# Patient Record
Sex: Male | Born: 1960 | Race: White | Hispanic: No | Marital: Single | Smoking: Current every day smoker
Health system: Southern US, Academic
[De-identification: ages and names within clinical notes are randomized; demographics above are authoritative.]

## PROBLEM LIST (undated history)

## (undated) DIAGNOSIS — F32A Depression, unspecified: Secondary | ICD-10-CM

## (undated) DIAGNOSIS — R21 Rash and other nonspecific skin eruption: Secondary | ICD-10-CM

## (undated) DIAGNOSIS — F329 Major depressive disorder, single episode, unspecified: Secondary | ICD-10-CM

## (undated) DIAGNOSIS — M5020 Other cervical disc displacement, unspecified cervical region: Secondary | ICD-10-CM

## (undated) DIAGNOSIS — B20 Human immunodeficiency virus [HIV] disease: Secondary | ICD-10-CM

## (undated) DIAGNOSIS — F419 Anxiety disorder, unspecified: Secondary | ICD-10-CM

## (undated) DIAGNOSIS — Z9109 Other allergy status, other than to drugs and biological substances: Secondary | ICD-10-CM

## (undated) DIAGNOSIS — L0591 Pilonidal cyst without abscess: Secondary | ICD-10-CM

## (undated) DIAGNOSIS — J45909 Unspecified asthma, uncomplicated: Secondary | ICD-10-CM

## (undated) DIAGNOSIS — F17200 Nicotine dependence, unspecified, uncomplicated: Secondary | ICD-10-CM

## (undated) DIAGNOSIS — Z21 Asymptomatic human immunodeficiency virus [HIV] infection status: Secondary | ICD-10-CM

## (undated) HISTORY — PX: CYST REMOVAL TRUNK: SHX6283

## (undated) HISTORY — DX: Anxiety disorder, unspecified: F41.9

## (undated) HISTORY — PX: HX CYST INCISION AND DRAINAGE: SHX14

## (undated) HISTORY — DX: Other cervical disc displacement, unspecified cervical region: M50.20

## (undated) HISTORY — DX: Other allergy status, other than to drugs and biological substances: Z91.09

## (undated) HISTORY — DX: Depression, unspecified: F32.A

## (undated) HISTORY — DX: Pilonidal cyst without abscess: L05.91

## (undated) HISTORY — DX: Rash and other nonspecific skin eruption: R21

## (undated) HISTORY — DX: Unspecified asthma, uncomplicated: J45.909

## (undated) HISTORY — DX: Human immunodeficiency virus (HIV) disease (CMS HCC): B20

---
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
## 1995-11-06 ENCOUNTER — Observation Stay (HOSPITAL_COMMUNITY): Payer: Self-pay | Admitting: Family Medicine

## 1997-08-13 ENCOUNTER — Ambulatory Visit (INDEPENDENT_AMBULATORY_CARE_PROVIDER_SITE_OTHER): Payer: Self-pay

## 1997-08-17 ENCOUNTER — Ambulatory Visit (INDEPENDENT_AMBULATORY_CARE_PROVIDER_SITE_OTHER): Payer: Self-pay | Admitting: Infectious Disease

## 1997-09-03 ENCOUNTER — Ambulatory Visit (INDEPENDENT_AMBULATORY_CARE_PROVIDER_SITE_OTHER): Payer: Self-pay | Admitting: Infectious Disease

## 1997-09-08 ENCOUNTER — Ambulatory Visit (INDEPENDENT_AMBULATORY_CARE_PROVIDER_SITE_OTHER): Payer: Self-pay

## 1997-09-17 ENCOUNTER — Ambulatory Visit (INDEPENDENT_AMBULATORY_CARE_PROVIDER_SITE_OTHER): Payer: Self-pay | Admitting: Infectious Disease

## 1997-10-15 ENCOUNTER — Ambulatory Visit (INDEPENDENT_AMBULATORY_CARE_PROVIDER_SITE_OTHER): Payer: Self-pay | Admitting: Infectious Disease

## 1997-11-12 ENCOUNTER — Ambulatory Visit (INDEPENDENT_AMBULATORY_CARE_PROVIDER_SITE_OTHER): Payer: Self-pay

## 1997-11-24 ENCOUNTER — Ambulatory Visit (INDEPENDENT_AMBULATORY_CARE_PROVIDER_SITE_OTHER): Payer: Self-pay | Admitting: Infectious Disease

## 1997-12-07 ENCOUNTER — Emergency Department (HOSPITAL_COMMUNITY): Payer: Self-pay

## 1997-12-08 ENCOUNTER — Emergency Department (HOSPITAL_COMMUNITY): Payer: Self-pay

## 1997-12-09 ENCOUNTER — Ambulatory Visit (INDEPENDENT_AMBULATORY_CARE_PROVIDER_SITE_OTHER): Payer: Self-pay

## 1997-12-10 ENCOUNTER — Inpatient Hospital Stay (HOSPITAL_COMMUNITY): Payer: Self-pay | Admitting: Internal Medicine

## 1997-12-23 ENCOUNTER — Ambulatory Visit (INDEPENDENT_AMBULATORY_CARE_PROVIDER_SITE_OTHER): Payer: Self-pay | Admitting: Ophthalmology

## 1997-12-24 ENCOUNTER — Ambulatory Visit (INDEPENDENT_AMBULATORY_CARE_PROVIDER_SITE_OTHER): Payer: Self-pay | Admitting: Infectious Disease

## 1997-12-29 ENCOUNTER — Ambulatory Visit (INDEPENDENT_AMBULATORY_CARE_PROVIDER_SITE_OTHER): Payer: Self-pay

## 1998-01-07 ENCOUNTER — Ambulatory Visit (INDEPENDENT_AMBULATORY_CARE_PROVIDER_SITE_OTHER): Payer: Self-pay | Admitting: Ophthalmology

## 1998-02-01 ENCOUNTER — Emergency Department (HOSPITAL_COMMUNITY): Payer: Self-pay

## 1998-02-02 ENCOUNTER — Ambulatory Visit (INDEPENDENT_AMBULATORY_CARE_PROVIDER_SITE_OTHER): Payer: Self-pay | Admitting: Infectious Disease

## 1998-02-18 ENCOUNTER — Ambulatory Visit (INDEPENDENT_AMBULATORY_CARE_PROVIDER_SITE_OTHER): Payer: Self-pay | Admitting: Infectious Disease

## 1998-03-02 ENCOUNTER — Ambulatory Visit (INDEPENDENT_AMBULATORY_CARE_PROVIDER_SITE_OTHER): Payer: Self-pay | Admitting: Ophthalmology

## 1998-03-23 ENCOUNTER — Ambulatory Visit (INDEPENDENT_AMBULATORY_CARE_PROVIDER_SITE_OTHER): Payer: Self-pay | Admitting: Ophthalmology

## 1998-05-17 ENCOUNTER — Ambulatory Visit (INDEPENDENT_AMBULATORY_CARE_PROVIDER_SITE_OTHER): Payer: Self-pay | Admitting: Infectious Disease

## 1998-07-30 ENCOUNTER — Ambulatory Visit (HOSPITAL_COMMUNITY): Payer: Self-pay

## 1998-08-02 ENCOUNTER — Emergency Department (HOSPITAL_COMMUNITY): Payer: Self-pay | Admitting: Emergency Medicine-WVUH

## 1998-08-05 ENCOUNTER — Ambulatory Visit (INDEPENDENT_AMBULATORY_CARE_PROVIDER_SITE_OTHER): Payer: Self-pay | Admitting: Infectious Disease

## 1998-08-27 ENCOUNTER — Ambulatory Visit (INDEPENDENT_AMBULATORY_CARE_PROVIDER_SITE_OTHER): Payer: Self-pay | Admitting: Infectious Disease

## 1998-10-20 ENCOUNTER — Ambulatory Visit (INDEPENDENT_AMBULATORY_CARE_PROVIDER_SITE_OTHER): Payer: Self-pay

## 1998-11-04 ENCOUNTER — Ambulatory Visit (INDEPENDENT_AMBULATORY_CARE_PROVIDER_SITE_OTHER): Payer: Self-pay | Admitting: Infectious Disease

## 1999-01-06 ENCOUNTER — Ambulatory Visit (INDEPENDENT_AMBULATORY_CARE_PROVIDER_SITE_OTHER): Payer: Self-pay | Admitting: Infectious Disease

## 1999-03-09 ENCOUNTER — Ambulatory Visit (INDEPENDENT_AMBULATORY_CARE_PROVIDER_SITE_OTHER): Payer: Self-pay

## 1999-03-24 ENCOUNTER — Ambulatory Visit (INDEPENDENT_AMBULATORY_CARE_PROVIDER_SITE_OTHER): Payer: Self-pay | Admitting: Infectious Disease

## 1999-05-19 ENCOUNTER — Ambulatory Visit (INDEPENDENT_AMBULATORY_CARE_PROVIDER_SITE_OTHER): Payer: Self-pay | Admitting: Infectious Disease

## 1999-09-15 ENCOUNTER — Ambulatory Visit (INDEPENDENT_AMBULATORY_CARE_PROVIDER_SITE_OTHER): Payer: Self-pay | Admitting: Infectious Disease

## 1999-11-14 ENCOUNTER — Ambulatory Visit (INDEPENDENT_AMBULATORY_CARE_PROVIDER_SITE_OTHER): Payer: Self-pay | Admitting: Infectious Disease

## 1999-12-29 ENCOUNTER — Ambulatory Visit (INDEPENDENT_AMBULATORY_CARE_PROVIDER_SITE_OTHER): Payer: Self-pay

## 2000-01-12 ENCOUNTER — Ambulatory Visit (INDEPENDENT_AMBULATORY_CARE_PROVIDER_SITE_OTHER): Payer: Self-pay | Admitting: Infectious Disease

## 2000-04-26 ENCOUNTER — Ambulatory Visit (INDEPENDENT_AMBULATORY_CARE_PROVIDER_SITE_OTHER): Payer: Self-pay | Admitting: Infectious Disease

## 2000-06-04 ENCOUNTER — Ambulatory Visit (INDEPENDENT_AMBULATORY_CARE_PROVIDER_SITE_OTHER): Payer: Self-pay

## 2000-07-05 ENCOUNTER — Ambulatory Visit (INDEPENDENT_AMBULATORY_CARE_PROVIDER_SITE_OTHER): Payer: Self-pay | Admitting: Infectious Disease

## 2000-08-14 ENCOUNTER — Ambulatory Visit (INDEPENDENT_AMBULATORY_CARE_PROVIDER_SITE_OTHER): Payer: Self-pay

## 2000-09-06 ENCOUNTER — Ambulatory Visit (INDEPENDENT_AMBULATORY_CARE_PROVIDER_SITE_OTHER): Payer: Self-pay | Admitting: Infectious Disease

## 2000-12-06 ENCOUNTER — Ambulatory Visit (INDEPENDENT_AMBULATORY_CARE_PROVIDER_SITE_OTHER): Payer: Self-pay | Admitting: Infectious Disease

## 2001-03-07 ENCOUNTER — Ambulatory Visit (INDEPENDENT_AMBULATORY_CARE_PROVIDER_SITE_OTHER): Payer: Self-pay | Admitting: Infectious Disease

## 2001-04-12 ENCOUNTER — Ambulatory Visit (INDEPENDENT_AMBULATORY_CARE_PROVIDER_SITE_OTHER): Payer: Self-pay

## 2001-05-20 ENCOUNTER — Ambulatory Visit (INDEPENDENT_AMBULATORY_CARE_PROVIDER_SITE_OTHER): Payer: Self-pay

## 2001-05-23 ENCOUNTER — Ambulatory Visit (INDEPENDENT_AMBULATORY_CARE_PROVIDER_SITE_OTHER): Payer: Self-pay

## 2001-06-05 ENCOUNTER — Ambulatory Visit (INDEPENDENT_AMBULATORY_CARE_PROVIDER_SITE_OTHER): Payer: Self-pay

## 2001-06-27 ENCOUNTER — Ambulatory Visit (INDEPENDENT_AMBULATORY_CARE_PROVIDER_SITE_OTHER): Payer: Self-pay | Admitting: Infectious Disease

## 2001-09-26 ENCOUNTER — Ambulatory Visit (INDEPENDENT_AMBULATORY_CARE_PROVIDER_SITE_OTHER): Payer: Self-pay | Admitting: Infectious Disease

## 2001-11-28 ENCOUNTER — Ambulatory Visit (INDEPENDENT_AMBULATORY_CARE_PROVIDER_SITE_OTHER): Payer: Self-pay | Admitting: Infectious Disease

## 2002-04-17 ENCOUNTER — Ambulatory Visit (INDEPENDENT_AMBULATORY_CARE_PROVIDER_SITE_OTHER): Payer: Self-pay | Admitting: Infectious Disease

## 2002-07-25 ENCOUNTER — Ambulatory Visit (INDEPENDENT_AMBULATORY_CARE_PROVIDER_SITE_OTHER): Payer: Self-pay | Admitting: Infectious Disease

## 2002-11-05 ENCOUNTER — Ambulatory Visit (INDEPENDENT_AMBULATORY_CARE_PROVIDER_SITE_OTHER): Payer: Self-pay | Admitting: Infectious Disease

## 2003-02-25 ENCOUNTER — Ambulatory Visit (INDEPENDENT_AMBULATORY_CARE_PROVIDER_SITE_OTHER): Payer: Self-pay | Admitting: Infectious Disease

## 2003-03-18 ENCOUNTER — Ambulatory Visit (INDEPENDENT_AMBULATORY_CARE_PROVIDER_SITE_OTHER): Payer: Self-pay | Admitting: Infectious Disease

## 2003-05-27 ENCOUNTER — Ambulatory Visit (INDEPENDENT_AMBULATORY_CARE_PROVIDER_SITE_OTHER): Payer: Self-pay

## 2003-06-09 ENCOUNTER — Ambulatory Visit (INDEPENDENT_AMBULATORY_CARE_PROVIDER_SITE_OTHER): Payer: Self-pay | Admitting: Infectious Disease

## 2003-06-17 ENCOUNTER — Ambulatory Visit (INDEPENDENT_AMBULATORY_CARE_PROVIDER_SITE_OTHER): Payer: Self-pay | Admitting: Infectious Disease

## 2003-06-29 ENCOUNTER — Ambulatory Visit (INDEPENDENT_AMBULATORY_CARE_PROVIDER_SITE_OTHER): Payer: Self-pay

## 2003-07-28 ENCOUNTER — Ambulatory Visit (INDEPENDENT_AMBULATORY_CARE_PROVIDER_SITE_OTHER): Payer: Self-pay

## 2003-09-02 ENCOUNTER — Ambulatory Visit (INDEPENDENT_AMBULATORY_CARE_PROVIDER_SITE_OTHER): Payer: Self-pay

## 2003-09-29 ENCOUNTER — Ambulatory Visit (INDEPENDENT_AMBULATORY_CARE_PROVIDER_SITE_OTHER): Payer: Self-pay | Admitting: Infectious Disease

## 2003-12-01 ENCOUNTER — Ambulatory Visit (INDEPENDENT_AMBULATORY_CARE_PROVIDER_SITE_OTHER): Payer: Self-pay | Admitting: Infectious Disease

## 2003-12-02 ENCOUNTER — Ambulatory Visit (INDEPENDENT_AMBULATORY_CARE_PROVIDER_SITE_OTHER): Payer: Self-pay | Admitting: Infectious Disease

## 2004-02-03 ENCOUNTER — Ambulatory Visit (INDEPENDENT_AMBULATORY_CARE_PROVIDER_SITE_OTHER): Payer: Self-pay

## 2004-02-09 ENCOUNTER — Ambulatory Visit (INDEPENDENT_AMBULATORY_CARE_PROVIDER_SITE_OTHER): Payer: Self-pay

## 2004-03-01 ENCOUNTER — Ambulatory Visit (INDEPENDENT_AMBULATORY_CARE_PROVIDER_SITE_OTHER): Payer: Self-pay | Admitting: Infectious Disease

## 2004-03-30 ENCOUNTER — Ambulatory Visit (INDEPENDENT_AMBULATORY_CARE_PROVIDER_SITE_OTHER): Payer: Self-pay | Admitting: Infectious Disease

## 2004-06-08 ENCOUNTER — Ambulatory Visit (INDEPENDENT_AMBULATORY_CARE_PROVIDER_SITE_OTHER): Payer: Self-pay | Admitting: Infectious Disease

## 2004-06-22 ENCOUNTER — Ambulatory Visit (INDEPENDENT_AMBULATORY_CARE_PROVIDER_SITE_OTHER): Payer: Self-pay | Admitting: Infectious Disease

## 2004-06-27 ENCOUNTER — Ambulatory Visit (INDEPENDENT_AMBULATORY_CARE_PROVIDER_SITE_OTHER): Payer: Self-pay

## 2004-07-05 ENCOUNTER — Ambulatory Visit (INDEPENDENT_AMBULATORY_CARE_PROVIDER_SITE_OTHER): Payer: Self-pay

## 2005-07-12 ENCOUNTER — Ambulatory Visit (INDEPENDENT_AMBULATORY_CARE_PROVIDER_SITE_OTHER): Payer: Self-pay | Admitting: Infectious Disease

## 2005-09-26 ENCOUNTER — Ambulatory Visit (HOSPITAL_COMMUNITY): Payer: Self-pay

## 2005-12-06 ENCOUNTER — Ambulatory Visit (INDEPENDENT_AMBULATORY_CARE_PROVIDER_SITE_OTHER): Payer: Self-pay | Admitting: Infectious Disease

## 2006-01-16 ENCOUNTER — Ambulatory Visit (HOSPITAL_COMMUNITY): Payer: Self-pay

## 2006-02-21 ENCOUNTER — Ambulatory Visit (INDEPENDENT_AMBULATORY_CARE_PROVIDER_SITE_OTHER): Payer: Self-pay | Admitting: Infectious Disease

## 2006-05-10 ENCOUNTER — Ambulatory Visit (HOSPITAL_COMMUNITY): Payer: Self-pay

## 2006-07-25 ENCOUNTER — Ambulatory Visit (INDEPENDENT_AMBULATORY_CARE_PROVIDER_SITE_OTHER): Payer: Self-pay | Admitting: Infectious Disease

## 2006-08-07 ENCOUNTER — Ambulatory Visit (INDEPENDENT_AMBULATORY_CARE_PROVIDER_SITE_OTHER): Payer: Self-pay

## 2006-08-16 ENCOUNTER — Ambulatory Visit (HOSPITAL_COMMUNITY): Payer: Self-pay

## 2006-09-14 ENCOUNTER — Ambulatory Visit (HOSPITAL_COMMUNITY): Payer: Self-pay

## 2006-10-12 ENCOUNTER — Encounter (HOSPITAL_COMMUNITY): Payer: Self-pay | Admitting: Clinical

## 2006-10-25 ENCOUNTER — Encounter (INDEPENDENT_AMBULATORY_CARE_PROVIDER_SITE_OTHER): Payer: Medicare Other

## 2006-10-26 ENCOUNTER — Encounter (HOSPITAL_COMMUNITY): Payer: Medicare Other | Admitting: Clinical

## 2006-10-31 ENCOUNTER — Ambulatory Visit (INDEPENDENT_AMBULATORY_CARE_PROVIDER_SITE_OTHER): Payer: Medicare Other

## 2006-11-07 ENCOUNTER — Encounter (INDEPENDENT_AMBULATORY_CARE_PROVIDER_SITE_OTHER): Payer: Medicare Other | Admitting: Infectious Disease

## 2006-11-07 ENCOUNTER — Ambulatory Visit
Admission: RE | Admit: 2006-11-07 | Discharge: 2006-11-07 | Disposition: A | Discharge status: Home / Self Care | Payer: Medicare Other | Attending: Infectious Disease | Admitting: Infectious Disease

## 2006-11-07 DIAGNOSIS — F17200 Nicotine dependence, unspecified, uncomplicated: Secondary | ICD-10-CM | POA: Insufficient documentation

## 2006-11-07 DIAGNOSIS — M542 Cervicalgia: Secondary | ICD-10-CM | POA: Insufficient documentation

## 2006-11-07 DIAGNOSIS — F3289 Other specified depressive episodes: Secondary | ICD-10-CM | POA: Insufficient documentation

## 2006-11-07 DIAGNOSIS — R634 Abnormal weight loss: Secondary | ICD-10-CM | POA: Insufficient documentation

## 2006-11-07 DIAGNOSIS — J45909 Unspecified asthma, uncomplicated: Secondary | ICD-10-CM | POA: Insufficient documentation

## 2006-11-07 DIAGNOSIS — G609 Hereditary and idiopathic neuropathy, unspecified: Secondary | ICD-10-CM | POA: Insufficient documentation

## 2006-11-07 DIAGNOSIS — B20 Human immunodeficiency virus [HIV] disease: Secondary | ICD-10-CM | POA: Insufficient documentation

## 2007-01-16 ENCOUNTER — Encounter (INDEPENDENT_AMBULATORY_CARE_PROVIDER_SITE_OTHER): Payer: Medicare Other | Admitting: Infectious Disease

## 2007-02-18 ENCOUNTER — Encounter (INDEPENDENT_AMBULATORY_CARE_PROVIDER_SITE_OTHER): Payer: Medicare Other | Admitting: Ophthalmology

## 2007-03-12 ENCOUNTER — Encounter (INDEPENDENT_AMBULATORY_CARE_PROVIDER_SITE_OTHER): Payer: Medicare Other

## 2007-04-17 ENCOUNTER — Encounter (INDEPENDENT_AMBULATORY_CARE_PROVIDER_SITE_OTHER): Payer: Medicare Other | Admitting: Infectious Disease

## 2007-04-17 ENCOUNTER — Ambulatory Visit
Admission: RE | Admit: 2007-04-17 | Discharge: 2007-04-17 | Disposition: A | Discharge status: Home / Self Care | Payer: Medicare Other | Attending: Infectious Disease | Admitting: Infectious Disease

## 2007-04-17 DIAGNOSIS — B20 Human immunodeficiency virus [HIV] disease: Secondary | ICD-10-CM | POA: Insufficient documentation

## 2007-04-24 ENCOUNTER — Encounter (INDEPENDENT_AMBULATORY_CARE_PROVIDER_SITE_OTHER): Payer: Medicare Other | Admitting: Infectious Disease

## 2007-05-29 ENCOUNTER — Encounter (INDEPENDENT_AMBULATORY_CARE_PROVIDER_SITE_OTHER): Payer: Medicare Other | Admitting: Infectious Disease

## 2007-05-29 ENCOUNTER — Encounter (INDEPENDENT_AMBULATORY_CARE_PROVIDER_SITE_OTHER): Payer: Medicare Other

## 2007-07-02 ENCOUNTER — Other Ambulatory Visit: Payer: Self-pay

## 2007-07-02 ENCOUNTER — Encounter (INDEPENDENT_AMBULATORY_CARE_PROVIDER_SITE_OTHER): Payer: Medicare Other

## 2007-08-21 ENCOUNTER — Encounter (INDEPENDENT_AMBULATORY_CARE_PROVIDER_SITE_OTHER): Payer: Medicare Other | Admitting: Infectious Disease

## 2007-08-21 ENCOUNTER — Ambulatory Visit
Admission: RE | Admit: 2007-08-21 | Discharge: 2007-08-21 | Disposition: A | Discharge status: Home / Self Care | Payer: Medicare Other | Attending: Infectious Disease | Admitting: Infectious Disease

## 2007-08-21 DIAGNOSIS — Z5181 Encounter for therapeutic drug level monitoring: Secondary | ICD-10-CM | POA: Insufficient documentation

## 2007-08-21 DIAGNOSIS — B20 Human immunodeficiency virus [HIV] disease: Secondary | ICD-10-CM | POA: Insufficient documentation

## 2007-08-21 LAB — CBC/DIFF
BASOPHILS: 0 % (ref 0–1)
BASOS ABS: 0.033 THOU/uL (ref 0.0–0.2)
EOS ABS: 0.125 THOU/uL (ref 0.1–0.3)
EOSINOPHIL: 2 % (ref 1–6)
HCT: 51.4 % — ABNORMAL HIGH (ref 39.8–50.2)
HGB: 17.7 g/dL — ABNORMAL HIGH (ref 13.1–17.3)
LYMPHOCYTES: 17 % — ABNORMAL LOW (ref 20–45)
LYMPHS ABS: 1.21 THOU/uL (ref 1.0–4.8)
MCH: 31.2 pg (ref 27.4–33.0)
MCHC: 34.5 g/dL (ref 31.6–35.5)
MCV: 90.5 fL (ref 78–100)
MONOCYTES: 7 % (ref 4–13)
MONOS ABS: 0.472 THOU/uL (ref 0.1–0.9)
MPV: 9.4 FL (ref 7.4–10.4)
PLATELET COUNT: 148 10*3/uL (ref 140–450)
PMN ABS: 5.28 THOU/uL (ref 1.3–7.7)
PMN'S: 74 % (ref 40–75)
RBC: 5.68 MIL/uL (ref 4.46–5.70)
RDW: 11.8 % (ref 11.5–14.5)
WBC: 7.1 THOU/UL (ref 3.5–11.0)

## 2007-08-21 LAB — AST (SGOT): AST (SGOT): 15 U/L (ref 10–40)

## 2007-08-21 LAB — CREATININE WITH EGFR
CREATININE: 0.86 mg/dL (ref 0.62–1.27)
ESTIMATED GLOMERULAR FILTRATION RATE: >59 ml/min/1.73m2 (ref >59)

## 2007-08-21 LAB — CD4/CD8
ABSOLUTE CD4 COUNT: 388 {cells}/uL (ref 382–1614)
ABSOLUTE CD8 COUNT: 599 {cells}/uL (ref 157–813)
ABSOLUTE T LYMPH COUNT: 982 {cells}/uL (ref 892–2436)
CD3: 73 %
CD4: 29 %
CD4:CD8 RATIO: 0.7 — ABNORMAL LOW (ref 1.0–3.4)
CD8: 44 %

## 2007-08-21 LAB — BUN
BUN/CREAT RATIO: 9 (ref 6–22)
BUN: 8 mg/dL (ref 8–26)

## 2007-08-21 LAB — ALT (SGPT): ALT (SGPT): 11 U/L (ref 10–46)

## 2007-08-21 LAB — ALK PHOS (ALKALINE PHOSPHATASE): ALKALINE PHOSPHATASE: 44 U/L (ref 38–126)

## 2007-08-29 LAB — HIV-1 RNA QUANTITATIVE PCR, PLASMA: HIV ULTRA VIRAL LOAD (PCR): <50 COPIES/mL

## 2007-09-12 ENCOUNTER — Telehealth (INDEPENDENT_AMBULATORY_CARE_PROVIDER_SITE_OTHER): Payer: Self-pay | Admitting: Infectious Disease

## 2007-09-12 NOTE — Telephone Encounter (Signed)
 Dr. Gasper,  Mr. Mcbroom's Medicare part D company has denied our attempts at getting Skelaxin prior authorized.  Must use one of the following:    1.  Baclofen  2.  Cyclobenzaprine (Flexeril)  3.  Chlorzoxazone (Paraflex, Relaxazone)  4.  Methocarbamol  (Robaxin )  5.  Carisoprodol (Soma, Vanadom)    He knows you are out of town and says there is no rush.. He does have Skelaxin left.  Please prescribe alternate drug.

## 2007-09-26 MED ORDER — METHOCARBAMOL 500 MG TABLET
ORAL_TABLET | Freq: Four times a day (QID) | ORAL | Status: DC
Start: 2007-09-26 — End: 2008-11-11

## 2007-10-01 ENCOUNTER — Ambulatory Visit (INDEPENDENT_AMBULATORY_CARE_PROVIDER_SITE_OTHER): Payer: Medicare Other

## 2007-10-01 MED ORDER — BUPROPION HCL SR 150 MG TABLET,12 HR SUSTAINED-RELEASE
150.00 mg | ORAL_TABLET | Freq: Two times a day (BID) | ORAL | Status: DC
Start: 2007-10-01 — End: 2007-10-01

## 2007-10-01 MED ORDER — ALPRAZOLAM 0.5 MG TABLET
0.50 mg | ORAL_TABLET | Freq: Three times a day (TID) | ORAL | Status: DC
Start: 2007-10-01 — End: 2008-11-11

## 2007-10-01 MED ORDER — ALPRAZOLAM 0.5 MG TABLET
0.50 mg | ORAL_TABLET | Freq: Three times a day (TID) | ORAL | Status: DC
Start: 2007-10-01 — End: 2007-10-01

## 2007-10-01 MED ORDER — BUPROPION HCL SR 150 MG TABLET,12 HR SUSTAINED-RELEASE
150.00 mg | ORAL_TABLET | Freq: Two times a day (BID) | ORAL | Status: DC
Start: 2007-10-01 — End: 2008-11-11
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
## 2007-10-07 ENCOUNTER — Encounter (INDEPENDENT_AMBULATORY_CARE_PROVIDER_SITE_OTHER): Payer: Self-pay

## 2007-11-06 ENCOUNTER — Encounter (INDEPENDENT_AMBULATORY_CARE_PROVIDER_SITE_OTHER): Payer: Medicare Other

## 2007-11-06 ENCOUNTER — Ambulatory Visit
Admission: RE | Admit: 2007-11-06 | Discharge: 2007-11-06 | Disposition: A | Discharge status: Home / Self Care | Payer: Medicare Other | Attending: Infectious Disease | Admitting: Infectious Disease

## 2007-11-06 DIAGNOSIS — B20 Human immunodeficiency virus [HIV] disease: Secondary | ICD-10-CM | POA: Insufficient documentation

## 2007-11-06 LAB — CBC/DIFF
BASOPHILS: 1 % (ref 0–1)
BASOS ABS: 0.068 THOU/uL (ref 0.0–0.2)
EOS ABS: 0.125 THOU/uL (ref 0.1–0.3)
EOSINOPHIL: 2 % (ref 1–6)
HCT: 53.8 % — ABNORMAL HIGH (ref 39.8–50.2)
HGB: 17.3 g/dL (ref 13.1–17.3)
LYMPHOCYTES: 21 % (ref 20–45)
LYMPHS ABS: 1.72 THOU/uL (ref 1.0–4.8)
MCH: 28.7 pg (ref 27.4–33.0)
MCHC: 32.2 g/dL (ref 31.6–35.5)
MCV: 88.9 fL (ref 78–100)
MONOCYTES: 6 % (ref 4–13)
MONOS ABS: 0.527 THOU/uL (ref 0.1–0.9)
MPV: 8.7 FL (ref 7.4–10.4)
PLATELET COUNT: 200 THO/UL (ref 140–450)
PMN ABS: 5.77 10*3/uL (ref 1.3–7.7)
PMN'S: 70 % (ref 40–75)
RBC: 6.05 MIL/uL — ABNORMAL HIGH (ref 4.46–5.70)
RDW: 12.4 % (ref 11.5–14.5)
WBC: 8.2 THOU/UL (ref 3.5–11.0)

## 2007-11-06 LAB — ALT (SGPT): ALT (SGPT): 11 U/L (ref 10–46)

## 2007-11-06 LAB — AST (SGOT): AST (SGOT): 16 U/L (ref 10–40)

## 2007-11-06 LAB — BUN
BUN/CREAT RATIO: 9 (ref 6–22)
BUN: 10 mg/dL (ref 8–26)

## 2007-11-06 LAB — CREATININE: CREATININE: 1.1 mg/dL (ref 0.62–1.27)

## 2007-11-06 LAB — ALK PHOS (ALKALINE PHOSPHATASE): ALKALINE PHOSPHATASE: 58 U/L (ref 38–126)

## 2007-11-06 LAB — CREATININE WITH EGFR: ESTIMATED GLOMERULAR FILTRATION RATE: >59 ml/min/1.73m2 (ref >59)

## 2007-11-07 LAB — CD4/CD8
ABSOLUTE CD4 COUNT: 439 cells/ul (ref 382–1614)
ABSOLUTE CD8 COUNT: 660 {cells}/uL (ref 157–813)
ABSOLUTE T LYMPH COUNT: 1094 {cells}/uL (ref 892–2436)
CD3: 74 %
CD4: 30 %
CD4:CD8 RATIO: 0.7 — ABNORMAL LOW (ref 1.0–3.4)
CD8: 45 %

## 2007-11-14 LAB — HIV-1 RNA QUANTITATIVE PCR, PLASMA: HIV ULTRA VIRAL LOAD (PCR): 87 {copies}/mL

## 2007-11-18 ENCOUNTER — Ambulatory Visit (INDEPENDENT_AMBULATORY_CARE_PROVIDER_SITE_OTHER): Payer: Medicare Other

## 2007-11-21 NOTE — Progress Notes (Signed)
[facility] Department of Medicine  [address]  [place], [place] [address]      PROGRESS NOTE    PATIENT NAME: Mike [name], Mike [name]  CHART NUMBER: [id number]  DATE OF BIRTH: [birth year]  DATE OF SERVICE: 11/06/2007    PROBLEM LIST:  1.  HIV infection.  2.  Asthma.  3.  Depression.  4.  Nicotine dependence.  5.  History of herpes simplex keratitis.  6.  History of Pneumocystis pneumonia.  7.  Painful peripheral neuropathy.    SUBJECTIVE:  Overall, Mike [name] has been doing very well.  He has no complaints again today.  He states he is going to be moving to [place] in July to help out with family members.  He seems overall had happy about this situation.  He does not complain of any fevers today or pain anywhere.  His asthma is under good control.  He has been taking his medication for HIV 100% of the time.  He has not had depressive symptoms.    Otherwise, review of systems is negative.    OBJECTIVE:  He is afebrile, pulse is 80, respirations are 12, blood pressure 120/80, weight is 151.8 pounds.  He is 6 feet 2 inches tall.  He has no skin rash anywhere.  He has multiple piercings in both nares, his ears and also his tongue.  All sites are clean and not infected.  He has no scleral icterus.  Oropharynx has no lesions.  No lymphadenopathy palpated.  Chest is clear to auscultation and percussion.  Heart:  Regular rate and rhythm, normal S1 S2, no murmurs or gallops.  Abdomen is soft, nontender, no organomegaly, no masses or tenderness.  Pulses all full and equal 2+.  Extremities:  No edema.  Joints all full range of motion.  Neurological intact.    HIV viral load less than 50 and CD4 count 613 on 04/17/2007.    LABS:  Total cholesterol 156, HDL 30, LDL 95 and triglycerides total 155.  Liver enzymes are normal, total bilirubin 0.6, creatinine 1.05, white blood cell count and differential normal.    Serum syphilis test was negative.    ASSESSMENT:  1.  HIV infection, doing very well on current regimen.   2.  Asthma, under control.  3.  Depression, doing fairly well right now and not on any medication.  4.  Nicotine dependence, still smoking.  5.  Other problems stable as listed above.    PLAN:  1.  We will continue his current therapy for his HIV, which is Atripla one tablet [name] day.  2.  Other medications include Lyrica 50 mg [name] day, Skelaxin 800 mg [name] day, Singulair 10 mg [name] day, Advair one puff b.i.d., Xanax 0.5 mg t.i.d.  The patient was advised by Psychiatry to taper this off over one month.  However, he has not started to do this.  He said he is going to stop all at once.  I cautioned him to definitely not stop all at once, but to gradually taper the drug over at least [name] month or two.  We discussed this at length.  The patient voiced understanding.  Also, he will continue Wellbutrin 50 mg b.i.d.    3.  He will return to clinic at the end of June or beginning in July once more before he is moving away.  Also, we will help him at that point to sign [name] release of information form, so that he can have his  records faxed to his new physicians in [place].    4.  I encouraged him to cut down smoking.  He is not currently sexually active but states he will use condoms when he is sexually active.      [name], MD  Professor; Section of Infectious Disease  [place] Department of Medicine    [id number]; D: 11/06/2007 16:21:12; T: 11/07/2007 22:00:18

## 2008-01-07 ENCOUNTER — Encounter (HOSPITAL_COMMUNITY): Payer: Medicare Other

## 2008-02-05 ENCOUNTER — Ambulatory Visit
Admission: RE | Admit: 2008-02-05 | Discharge: 2008-02-05 | Disposition: A | Discharge status: Home / Self Care | Payer: Medicare Other | Attending: Infectious Disease | Admitting: Infectious Disease

## 2008-02-05 ENCOUNTER — Encounter (INDEPENDENT_AMBULATORY_CARE_PROVIDER_SITE_OTHER): Payer: Medicare Other | Admitting: Infectious Disease

## 2008-02-05 DIAGNOSIS — B20 Human immunodeficiency virus [HIV] disease: Secondary | ICD-10-CM | POA: Insufficient documentation

## 2008-02-05 LAB — CBC/DIFF
BASOPHILS: 0 % (ref 0–1)
BASOS ABS: 0.039 THOU/uL (ref 0.0–0.2)
EOS ABS: 0.131 THOU/uL (ref 0.1–0.3)
EOSINOPHIL: 2 % (ref 1–6)
HCT: 53.2 % — ABNORMAL HIGH (ref 39.8–50.2)
HGB: 17.4 g/dL — ABNORMAL HIGH (ref 13.1–17.3)
LYMPHOCYTES: 21 % (ref 20–45)
LYMPHS ABS: 1.71 THOU/uL (ref 1.0–4.8)
MCH: 29 pg (ref 27.4–33.0)
MCHC: 32.8 g/dL (ref 31.6–35.5)
MCV: 88.4 fL (ref 78–100)
MONOCYTES: 6 % (ref 4–13)
MONOS ABS: 0.488 10*3/uL (ref 0.1–0.9)
MPV: 8.4 FL (ref 7.4–10.4)
PLATELET COUNT: 199 THO/UL (ref 140–450)
PMN ABS: 5.96 THOU/uL (ref 1.3–7.7)
PMN'S: 71 % (ref 40–75)
RBC: 6.02 MIL/uL — ABNORMAL HIGH (ref 4.46–5.70)
RDW: 12.9 % (ref 11.5–14.5)
WBC: 8.3 THOU/UL (ref 3.5–11.0)

## 2008-02-05 LAB — LIPID PANEL
CHOLESTEROL: 192 mg/dL (ref <200)
HDL-CHOLESTEROL: 40 mg/dL (ref >39)
LDL (CALCULATED): 131 mg/dL — ABNORMAL HIGH (ref <100)
TRIGLYCERIDES: 105 mg/dL (ref <150)

## 2008-02-05 LAB — AST (SGOT): AST (SGOT): 21 U/L (ref 10–40)

## 2008-02-05 LAB — CREATININE WITH EGFR: ESTIMATED GLOMERULAR FILTRATION RATE: >59 ml/min/1.73m2 (ref >59)

## 2008-02-05 LAB — CD4/CD8
ABSOLUTE CD4 COUNT: 513 {cells}/uL (ref 382–1614)
ABSOLUTE CD8 COUNT: 855 {cells}/uL — ABNORMAL HIGH (ref 157–813)
ABSOLUTE T LYMPH COUNT: 1359 {cells}/uL (ref 892–2436)
CD3: 71 %
CD4: 27 %
CD4:CD8 RATIO: 0.6 — ABNORMAL LOW (ref 1.0–3.4)
CD8: 45 %

## 2008-02-05 LAB — ALK PHOS (ALKALINE PHOSPHATASE): ALKALINE PHOSPHATASE: 59 U/L (ref 38–126)

## 2008-02-05 LAB — ELECTROLYTES: POTASSIUM: 5.5 mmol/L — ABNORMAL HIGH (ref 3.5–5.1)

## 2008-02-05 LAB — BUN: BUN/CREAT RATIO: 8 (ref 6–22)

## 2008-02-05 LAB — ALT (SGPT): ALT (SGPT): 14 U/L (ref 10–46)

## 2008-02-11 NOTE — Progress Notes (Signed)
I saw and evaluated the patient. I reviewed the resident's note. I agree with the findings and plan of care as documented in the resident's note. Any exceptions/additions are noted.

## 2008-02-11 NOTE — Progress Notes (Signed)
[facility] Department of Medicine  [address]  [place], [place] [address]      PROGRESS NOTE    PATIENT NAME: Mike [name], Mike [name] A  CHART NUMBER: [id number]  DATE OF BIRTH: June 19, 1961  DATE OF SERVICE: 02/05/2008    PROBLEM LIST:    1.  HIV infection.   2.  Asthma.   3.  Depression.   4.  Nicotine dependence.   5.  History of herpes simplex keratitis.   6.  History of pneumocystis pneumonia.   7.  Painful peripheral neuropathy.     SUBJECTIVE:  The patient states he is doing pretty well today.  He had planned on moving next month, but states that that has been pushed back for a couple of months.  He will be moving to [facility].  He has recently moved.  He states that this is a good thing and away because his last place of living was infected with cockroaches, and this new place is much cleaner.  He states that he is sexually active with a few partners and they do not use any protection.  The patient is not interested in using any protection at this time.  He does state that his partners are also HIV infected.  He states that his asthma has been well controlled, and indicates that he has been taking his HIV medications 100 percent of the time.  He states his mood has been pretty good.  He smokes 2 packs per day, and is not interested in cutting back.  He does not drink any alcohol and does use occasional marijuana.    OBJECTIVE:  Vital signs:  Temperature 36.4, blood pressure 110/80, height 6 feet 2 inches, weight 147.  General:  The patient is in no acute distress.  Skin:  No apparent rashes.  He does have a mildly erythematous lesion on the top of his head.  He states that he often gets sunburned on the top of his head, and I suspect that this is possibly an AK.  He also has another lesion on his right lower rib cage that he has also noticed more recently.  Heart:  Regular rate and rhythm.  Lungs:  Clear to auscultation bilaterally.  Abdomen soft, nontender, nondistended with positive bowel  sounds.  HEENT revealed anicteric sclerae and mild cervical lymphadenopathy bilaterally.  Extremities:  No edema.    Labs:  CD4 in April at 439, viral load in April 87.  Serum syphilis test was negative.    ASSESSMENT:  1.  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  2.  Asthma, well controlled.  3.  Depression that is stable.  4.  Nicotine dependence.  5.  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    PLAN:  1.  We will continue his current therapy of the Atripla, and we will have him return to clinic in 1 month.  We will order electrolytes, BUN, creatinine, CBC, LFTs, lipids, viral load and CD4.  2.  We will refill his Singulair 10 mg a day.  3.  Refill his Lyrica 50 mg t.i.d.  4.  The patient will continue to take his Wellbutrin.  He follows up at [facility] and is not sure he plans on continuing to do so.  He stopped the Xanax cold [place] last month despite recommendations that he taper it, and has been off Xanax for one month.  5.  The patient was encouraged is to stop smoking and offered help with smoking  cessation, which he declined at this time.  6.  The patient was encouraged to use protection during sexual intercourse, but the patient declines to do so despite being educated about the risks involved.  7.  At next visit, he should receive PPD if appropriate and consider Adacel vaccination if no recent tetanus shot and no history of Adacel as adult.    Luiz Iron, MD  Resident  [place] Department of Family Medicine/Behavioral Medicine    [name], MD  Professor; Section of Infectious Disease  [place] Department of Medicine    [id number]; D: 02/05/2008 12:41:24; T: 02/07/2008 16:38:19

## 2008-02-13 LAB — HIV-1 RNA QUANTITATIVE PCR, PLASMA: HIV ULTRA VIRAL LOAD (PCR): <50 {copies}/mL

## 2008-04-08 ENCOUNTER — Encounter (INDEPENDENT_AMBULATORY_CARE_PROVIDER_SITE_OTHER): Payer: Medicare Other | Admitting: Infectious Disease

## 2008-05-20 ENCOUNTER — Encounter (INDEPENDENT_AMBULATORY_CARE_PROVIDER_SITE_OTHER): Payer: Medicare Other | Admitting: Infectious Disease

## 2008-06-24 ENCOUNTER — Ambulatory Visit (INDEPENDENT_AMBULATORY_CARE_PROVIDER_SITE_OTHER): Payer: Medicare Other | Admitting: Infectious Disease

## 2008-06-24 ENCOUNTER — Ambulatory Visit
Admission: RE | Admit: 2008-06-24 | Discharge: 2008-06-24 | Disposition: A | Discharge status: Home / Self Care | Payer: Medicare Other | Attending: Infectious Disease | Admitting: Infectious Disease

## 2008-06-24 DIAGNOSIS — G589 Mononeuropathy, unspecified: Secondary | ICD-10-CM | POA: Insufficient documentation

## 2008-06-24 DIAGNOSIS — J45909 Unspecified asthma, uncomplicated: Secondary | ICD-10-CM | POA: Insufficient documentation

## 2008-06-24 DIAGNOSIS — B20 Human immunodeficiency virus [HIV] disease: Secondary | ICD-10-CM | POA: Insufficient documentation

## 2008-06-24 LAB — CBC/DIFF
BASOPHILS: 1 % (ref 0–1)
BASOS ABS: 0.046 THOU/uL (ref 0.0–0.2)
EOS ABS: 0.193 THOU/uL (ref 0.1–0.3)
EOSINOPHIL: 2 % (ref 1–6)
HCT: 53.4 % — ABNORMAL HIGH (ref 39.8–50.2)
HGB: 17.8 g/dL — ABNORMAL HIGH (ref 13.1–17.3)
LYMPHOCYTES: 27 % (ref 20–45)
LYMPHS ABS: 2.08 THOU/uL (ref 1.0–4.8)
MCH: 28.8 pg (ref 27.4–33.0)
MCHC: 33.4 g/dL (ref 31.6–35.5)
MCV: 86.4 fL (ref 82.0–99.0)
MONOCYTES: 7 % (ref 4–13)
MONOS ABS: 0.532 THOU/uL (ref 0.1–0.9)
MPV: 8.8 FL (ref 7.4–10.4)
PLATELET COUNT: 165 THO/UL (ref 140–450)
PMN ABS: 4.9 THOU/uL (ref 1.5–7.7)
PMN'S: 63 % (ref 40–75)
RBC: 6.18 MIL/uL — ABNORMAL HIGH (ref 4.46–5.70)
RDW: 12.3 % (ref 10.2–14.0)
WBC: 7.8 THOU/UL (ref 3.5–11.0)

## 2008-06-24 LAB — LIPID PANEL
CHOLESTEROL: 171 mg/dL (ref <200)
HDL-CHOLESTEROL: 35 mg/dL — ABNORMAL LOW (ref >39)
LDL (CALCULATED): 118 mg/dL — ABNORMAL HIGH (ref <100)

## 2008-06-24 LAB — AST (SGOT): AST (SGOT): 15 U/L (ref 10–40)

## 2008-06-24 LAB — CD4/CD8
ABSOLUTE CD4 COUNT: 711 {cells}/uL (ref 382–1614)
ABSOLUTE CD8 COUNT: 1038 {cells}/uL — ABNORMAL HIGH (ref 157–813)
ABSOLUTE T LYMPH COUNT: 1729 cells/ul (ref 892–2436)
CD3: 72 %
CD4: 30 %
CD4:CD8 RATIO: 0.7 — ABNORMAL LOW (ref 1.0–3.4)
CD8: 43 %

## 2008-06-24 LAB — CREATININE WITH EGFR: ESTIMATED GLOMERULAR FILTRATION RATE: >59 ml/min/1.73m2 (ref >59)

## 2008-06-24 LAB — BUN: BUN/CREAT RATIO: 9 (ref 6–22)

## 2008-06-24 LAB — ALT (SGPT): ALT (SGPT): 9 U/L — ABNORMAL LOW (ref 10–46)

## 2008-06-24 NOTE — Progress Notes (Signed)
 Subjective:     Patient ID:  Mike [name] is an 47 y.o. male     Chief Complaint:  No chief complaint on file.      HPI 47 yo gentlemen is here today for a follow-up. He has no active complaints today and would like to get a refill on 4 medications today: Atripla, Advair , Singulair  and Lyrica . He also needs to get vaccinated for Pneumovax, Flu, H1N1, patient states. His asthma is well controlled, and neuropathy is too. He states he was supposed to move to [place]  where his sister is , but she says to him that she does'nt want to take care of anyone anymore. He states that is sexually active and has one partner on and off for the past 10 years, and that he also is infected with HIV and that they do not use protection. I counselled him and explained the benefit and using protection but the patient was not interested, he says he might as well not if he has to use protection. He's nicotine dependent and is not interested in quitting, he has tried wellbutrin  but stopped using it .    ROS  No active complaints    Constitutional: no fever, no weight loss, no night sweats and no weakness  HEENT: no sore throat, no ear discharge or nasal discharge.  CVS: no chest pain or palpitations  RESP: no SOB, n      Objective:     Physical Exam   There were no vitals taken for this visit.      .    Assessment & Plan:     No diagnosis found.

## 2008-06-25 LAB — NEISSERIA GONORRHOEAE DNA BY PCR

## 2008-06-25 LAB — CHLAMYDIA TRACHOMITIS DNA BY PCR (INHOUSE)

## 2008-06-26 LAB — HIV-1 RNA QUANTITATIVE PCR, PLASMA: HIV ULTRA VIRAL LOAD (PCR): <50 {copies}/mL

## 2008-06-29 NOTE — Progress Notes (Signed)
 [facility] Department of Medicine  [address]  [place], [place] [address]      PROGRESS NOTE    PATIENT NAME: Mike [name], Mike [name]  CHART NUMBER: [id number]  DATE OF BIRTH: 1960/11/23  DATE OF SERVICE: 06/24/2008    PROBLEM LIST:  1.  HIV infection.  2.  Asthma.   3.  Depression.  4.  Nicotine dependence.  5.  History of herpes simplex keratitis.  6.  History of Pneumocystis pneumonia.  7.  Painful peripheral neuropathy.  8.  Cervical pain.    SUBJECTIVE:  Mr. Vanscyoc overall has been doing fairly well.  His depression is under good control and he does not have any complaints regarding the depression.  He does have considerable neck pain.  He states that the Lyrica  is not helping much.  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  He denies any numbness, tingling or weakness of his extremities.  He continues to smoke and he is not interested in quitting.  He is ccasionally sexually active.  He states his occasional partner is HIV positive also and they do not use protection.  Otherwise, review of systems is negative.    OBJECTIVE:  Vital signs are stable.  He is afebrile.  Skin is clear.  He has numerous piercings as before.  He has no lymphadenopathy.  His extraocular movements are intact.  No scleral icterus.  Oropharynx is clear with no lesions present.  Chest is clear to auscultation and percussion with no wheezing today.  Heart:  Regular rate and rhythm, normal S1 S2, no murmurs, rubs or gallops.  Abdomen is soft, nontender, no organomegaly, no tenderness or masses.  Extremities:  No edema, clubbing, or cyanosis.  His strength in all four extremities was good.  He has no point tenderness over the neck, but he describes pain diffusely in the posterior cervical area.    Labs from 02/05/2008:  The HIV viral load was less than 50, undetectable, and CD4 count was 513.  Labs from today:  CD4 count 711.  Viral load is  pending.    Hemoglobin today is 17.8, platelet count 165,000, creatinine 0.98.  Cholesterol panel: Total cholesterol 171, HDL 35, LDL 118, triglycerides 90.  Liver enzymes are normal.    ASSESSMENT:  1.  HIV infection, doing well on current regimen with suppressed viral load.  2.  Cervical pain, probably secondary to degenerative disk disease.  3.  Nicotine dependence.  4.  Depression, under control.  5.  Asthma, under good control.    PLAN:    1.  Continue the current regimen of medications to include:    [name].  Atripla one tablet [name] day.  B.  Singulair  10 mg [name] day.  C.  Lyrica  50 mg t.i.d.  D.  Advair  Diskus.    2.  Return to clinic in several months.  3.  For his cervical pain worsening, we will refer him to orthopaedic surgery for further assessment of his cervical spine disease.  4.  Will order the H1N1 influenza vaccine for him today.  5.  I answered all his questions.  We discussed smoking cessation, but he declined attempting this.  6. Advise using condoms when sexually active.      Andrea Perry, MD  Professor; Section of Infectious Disease  [place] Department of Medicine    [id number]; D: 06/26/2008 99:67:77; T: 06/29/2008 05:42:42

## 2008-09-29 ENCOUNTER — Other Ambulatory Visit (INDEPENDENT_AMBULATORY_CARE_PROVIDER_SITE_OTHER): Payer: Self-pay | Admitting: Infectious Disease

## 2008-10-07 ENCOUNTER — Encounter (INDEPENDENT_AMBULATORY_CARE_PROVIDER_SITE_OTHER): Payer: Medicare Other | Admitting: Infectious Disease

## 2008-11-11 ENCOUNTER — Ambulatory Visit
Admission: RE | Admit: 2008-11-11 | Discharge: 2008-11-11 | Disposition: A | Discharge status: Home / Self Care | Payer: Medicare Other | Attending: Infectious Disease | Admitting: Infectious Disease

## 2008-11-11 ENCOUNTER — Ambulatory Visit (INDEPENDENT_AMBULATORY_CARE_PROVIDER_SITE_OTHER): Payer: Medicare Other | Admitting: Infectious Disease

## 2008-11-11 VITALS — BP 108/68 | HR 80 | Temp 98.2°F | Ht 74.0 in | Wt 149.8 lb

## 2008-11-11 DIAGNOSIS — B20 Human immunodeficiency virus [HIV] disease: Secondary | ICD-10-CM | POA: Insufficient documentation

## 2008-11-11 LAB — CBC/DIFF
BASOPHILS: 1 % (ref 0–1)
BASOS ABS: 0.049 THOU/uL (ref 0.0–0.2)
EOS ABS: 0.179 THOU/uL (ref 0.1–0.3)
EOSINOPHIL: 3 % (ref 1–6)
HCT: 51.9 % — ABNORMAL HIGH (ref 39.8–50.2)
HGB: 17.7 g/dL — ABNORMAL HIGH (ref 13.1–17.3)
LYMPHOCYTES: 27 % (ref 20–45)
LYMPHS ABS: 1.84 THOU/uL (ref 1.0–4.8)
MCH: 29.9 pg (ref 27.4–33.0)
MCHC: 34.1 g/dL (ref 31.6–35.5)
MCV: 87.7 fL (ref 82.0–99.0)
MONOCYTES: 7 % (ref 4–13)
MONOS ABS: 0.502 THOU/uL (ref 0.1–0.9)
MPV: 8.7 FL (ref 7.4–10.4)
NRBC'S: 0 /100{WBCs}
PLATELET COUNT: 183 THO/UL (ref 140–450)
PMN ABS: 4.18 THOU/uL (ref 1.5–7.7)
PMN'S: 62 % (ref 40–75)
RBC: 5.91 MIL/uL — ABNORMAL HIGH (ref 4.46–5.70)
RDW: 13.2 % (ref 10.2–14.0)
WBC: 6.8 THOU/UL (ref 3.5–11.0)

## 2008-11-11 LAB — BUN
BUN/CREAT RATIO: 10 (ref 6–22)
BUN: 9 mg/dL (ref 8–26)

## 2008-11-11 LAB — ALT (SGPT): ALT (SGPT): 15 U/L (ref 7–55)

## 2008-11-11 LAB — CREATININE
CREATININE: 0.92 mg/dL (ref 0.62–1.27)
ESTIMATED GLOMERULAR FILTRATION RATE: >59 mL/min/{1.73_m2} (ref >59)

## 2008-11-11 LAB — AST (SGOT): AST (SGOT): 23 U/L (ref 8–48)

## 2008-11-11 LAB — BILIRUBIN TOTAL: BILIRUBIN, TOTAL: 0.8 mg/dL (ref 0.3–1.3)

## 2008-11-11 LAB — ALK PHOS (ALKALINE PHOSPHATASE): ALKALINE PHOSPHATASE: 61 U/L (ref 38–126)

## 2008-11-11 NOTE — Progress Notes (Signed)
S: Feels well. No complaints at all.  Taking meds 100% of the time.  Asthma doing well too.  Not sexually active at present.  Pain in neck about the same.  Only takes Lyrica when really needs it - it does not help all that well. Otherwise ROS neg.    Current outpatient prescriptions   Medication Sig   . efavirenz-emtricitabine-tenofovir (ATRIPLA) 600-200-300 mg Tab take 1 Tab by mouth QAM.    . pregabalin (LYRICA) 50 mg Cap take 1 Cap by mouth Three times a day as needed    . ADVAIR HFA INHL take 1 Puff by inalation Twice daily.    . montelukast (SINGULAR) 10 mg Tab take 1 Tab by mouth Once a day.    Marland Kitchen DISCONTD: buPROPion (WELLBUTRIN SR) 150 mg TbSR take 1 Tab by mouth Twice daily.    Marland Kitchen DISCONTD: alprazolam (XANAX) 0.5 mg Tab take 1 Tab by mouth Three times a day.    Marland Kitchen DISCONTD: methocarbamol (ROBAXIN) 500 mg Tab take by mouth Four times a day. Take 2-3 tablets by mouth 4x/day for pain        Filed Vitals:    11/11/2008  2:48 PM   BP: 108/68   Pulse: 80   Temp: 36.8 C (98.2 F)   TempSrc: Tympanic   Height: 1.88 m (6\' 2" )   Weight: 67.949 kg (149 lb 12.8 oz)       Appears well.  No jaundice.  No rashes.  Oro- clear, mucosa pink.  No lymphadenopathy.  Lungs clear to P and A.  Heart - normal S1 and S2.  No murmurs.  Abd - normal BS .  No organomegaly or tenderness.   Extrem - no edema.      Labs - CD4 700 and HIV viral load <50, undetectable in Jan 2010.    A: HIV infection, asymptomatic and taking his meds faithfully.    Depression, doing well off meds.  Asthma, under control  Neck pain, osteoarthritis of cervical spine.     Plans:  Continue Atripla one tab a day.  Get labs today - routine plus CD4 ct, HIV viral load.    Order Quantiferon next time  Advised to make dental appt soon  Will need colonoscopy in the next year - turning 48 y.o.  RTC 4 months  Refer to Ortho re: cervical spine disease for eval.  Continue Asthma meds  Advised to stop smoking

## 2008-11-12 LAB — CD4/CD8
ABSOLUTE CD4 COUNT: 640 {cells}/uL (ref 382–1614)
ABSOLUTE CD8 COUNT: 918 {cells}/uL — ABNORMAL HIGH (ref 157–813)
ABSOLUTE T LYMPH COUNT: 1555 {cells}/uL (ref 892–2436)
CD3: 74 %
CD4: 31 %
CD4:CD8 RATIO: 0.7 — ABNORMAL LOW (ref 1.0–3.4)
CD8: 44 %

## 2008-11-13 LAB — CHLAMYDIA TRACHOMITIS DNA BY PCR (INHOUSE)

## 2008-11-13 LAB — HIV-1 RNA QUANTITATIVE PCR, PLASMA: HIV ULTRA VIRAL LOAD (PCR): <50 COPIES/mL

## 2008-11-13 LAB — NEISSERIA GONORRHOEAE DNA BY PCR

## 2008-11-19 ENCOUNTER — Telehealth (INDEPENDENT_AMBULATORY_CARE_PROVIDER_SITE_OTHER): Payer: Self-pay | Admitting: Infectious Disease

## 2008-11-19 MED ORDER — NYSTATIN 100,000 UNIT/GRAM TOPICAL CREAM
TOPICAL_CREAM | Freq: Three times a day (TID) | CUTANEOUS | Status: DC
Start: 2008-11-19 — End: 2009-03-17

## 2008-12-17 ENCOUNTER — Ambulatory Visit (INDEPENDENT_AMBULATORY_CARE_PROVIDER_SITE_OTHER): Payer: Self-pay

## 2008-12-18 ENCOUNTER — Encounter (INDEPENDENT_AMBULATORY_CARE_PROVIDER_SITE_OTHER): Payer: Self-pay | Admitting: Infectious Disease

## 2008-12-22 NOTE — Progress Notes (Signed)
[facility] Department of Medicine  [address]  [place], [place] [address]      PROGRESS NOTE    PATIENT NAME: Mike [name], Mike [name]  CHART NUMBER: [id number]  DATE OF BIRTH: 1960-12-16  DATE OF SERVICE: 12/18/2008    TELEPHONE CALL NOTE:  I received [name] phone call from this patient this morning.  He is [name] patient of Dr. Sherrie Mustache.  Dr. Sherrie Mustache is out of town.  The message was about refills for his nebulizer, which is albuterol.  I called him at home.  His telephone number is [phone].  He told me that he has asthma, and he told me he was having "an asthma attack", and yesterday he felt he was almost choking and could not get his breath.  He said he took his dog for [name] walk and felt that he almost passed out.  I have asked him to go to an emergency department to be evaluated rather than just prescribing [name] bronchodilator on the phone because I told him that it is very important that if he is having  acute attack of asthma that he needs to be evaluated and managed appropriately.  He told me that he might have some problems with transportation.  I have talked to Tabtha Coombs in our [facility] to see if he can be helped.      Dennison Bulla, MD  Professor; Chief, Section of Infectious Disease  [place] Department of Medicine    [id number]; D: 12/18/2008 09:27:45; T: 12/22/2008 04:35:36    cc: [name] MD      Shirleen Schirmer

## 2009-01-20 ENCOUNTER — Ambulatory Visit (INDEPENDENT_AMBULATORY_CARE_PROVIDER_SITE_OTHER): Payer: Medicare Other | Admitting: PHYSICAL MEDICINE AND REHABILITATION

## 2009-02-18 ENCOUNTER — Ambulatory Visit (INDEPENDENT_AMBULATORY_CARE_PROVIDER_SITE_OTHER): Payer: Self-pay | Admitting: Infectious Disease

## 2009-03-17 ENCOUNTER — Encounter (INDEPENDENT_AMBULATORY_CARE_PROVIDER_SITE_OTHER): Payer: Medicare Other | Admitting: Infectious Disease

## 2009-03-17 ENCOUNTER — Ambulatory Visit (INDEPENDENT_AMBULATORY_CARE_PROVIDER_SITE_OTHER): Payer: Medicare Other | Admitting: Infectious Disease

## 2009-03-17 ENCOUNTER — Ambulatory Visit
Admission: RE | Admit: 2009-03-17 | Discharge: 2009-03-17 | Disposition: A | Discharge status: Home / Self Care | Payer: Medicare Other | Attending: Infectious Disease | Admitting: Infectious Disease

## 2009-03-17 VITALS — BP 108/70 | HR 80 | Temp 98.2°F | Ht 74.0 in | Wt 142.6 lb

## 2009-03-17 DIAGNOSIS — B20 Human immunodeficiency virus [HIV] disease: Secondary | ICD-10-CM | POA: Insufficient documentation

## 2009-03-17 LAB — CBC/DIFF
BASOPHILS: 1 % (ref 0–1)
BASOS ABS: 0.081 THOU/uL (ref 0.0–0.2)
EOS ABS: 0.13 THOU/uL (ref 0.1–0.3)
EOSINOPHIL: 2 % (ref 1–6)
HCT: 53.9 % — ABNORMAL HIGH (ref 39.8–50.2)
HGB: 18.1 g/dL — ABNORMAL HIGH (ref 13.1–17.3)
LYMPHOCYTES: 31 % (ref 20–45)
LYMPHS ABS: 2.05 THOU/uL (ref 1.0–4.8)
MCH: 29.3 pg (ref 27.4–33.0)
MCHC: 33.6 g/dL (ref 31.6–35.5)
MCV: 87.2 fL (ref 82.0–99.0)
MONOCYTES: 6 % (ref 4–13)
MONOS ABS: 0.38 THOU/uL (ref 0.1–0.9)
MPV: 9.3 FL (ref 7.4–10.4)
NRBC'S: 0 /100{WBCs}
PLATELET COUNT: 172 THO/UL (ref 140–450)
PMN ABS: 3.91 THOU/uL (ref 1.5–7.7)
PMN'S: 60 % (ref 40–75)
RBC: 6.18 MIL/uL — ABNORMAL HIGH (ref 4.46–5.70)
RDW: 12.6 % (ref 10.2–14.0)
WBC: 6.6 THOU/UL (ref 3.5–11.0)

## 2009-03-17 LAB — BUN
BUN/CREAT RATIO: 6 (ref 6–22)
BUN: 5 mg/dL — ABNORMAL LOW (ref 8–26)

## 2009-03-17 LAB — CREATININE WITH EGFR: ESTIMATED GLOMERULAR FILTRATION RATE: >59 ml/min/1.73m2 (ref >59)

## 2009-03-17 LAB — CREATININE: CREATININE: 0.84 mg/dL (ref 0.62–1.27)

## 2009-03-17 LAB — AST (SGOT): AST (SGOT): 17 U/L (ref 8–48)

## 2009-03-17 LAB — ALT (SGPT): ALT (SGPT): 15 U/L (ref 7–55)

## 2009-03-17 LAB — BILIRUBIN TOTAL: BILIRUBIN, TOTAL: 0.9 mg/dL (ref 0.3–1.3)

## 2009-03-17 LAB — ALK PHOS (ALKALINE PHOSPHATASE): ALKALINE PHOSPHATASE: 50 U/L (ref 38–126)

## 2009-03-18 LAB — CD4/CD8
ABSOLUTE CD4 COUNT: 727 {cells}/uL (ref 382–1614)
ABSOLUTE T LYMPH COUNT: 1671 {cells}/uL (ref 892–2436)
CD3: 79 %
CD4: 34 %
CD4:CD8 RATIO: 0.8 — ABNORMAL LOW (ref 1.0–3.4)
CD8: 45 %

## 2009-03-18 LAB — SYPHILIS AB SCREEN, WITH REFLEX: RPR: NONREACTIVE

## 2009-03-19 LAB — NEISSERIA GONORRHOEAE DNA BY PCR

## 2009-03-19 LAB — CHLAMYDIA TRACHOMITIS DNA BY PCR (INHOUSE)

## 2009-03-20 LAB — MYCOBACTERIUM TUBERCULOSIS INFECTION DETERMINATION BY QUANTIFERON-TB G: QUANTIFERON-TB GOLD IN TUBE: 0

## 2009-03-30 ENCOUNTER — Ambulatory Visit (INDEPENDENT_AMBULATORY_CARE_PROVIDER_SITE_OTHER): Payer: Self-pay | Admitting: Psychiatry

## 2009-03-30 NOTE — Telephone Encounter (Signed)
Triage Queue message copied by Ileene Hutchinson on Tue Mar 30, 2009 11:02 AM  ------   Message from: [place], Harriett Sine   Created: Tue Mar 30, 2009 10:57 AM    >> Arlis Porta Tue Mar 30, 2009 10:57 am  Huel Cote Pt: Pt cannot make his appt. On 03/31/09 and would like to reschedule. Call pt with time and date. Thank you

## 2009-03-31 ENCOUNTER — Ambulatory Visit (INDEPENDENT_AMBULATORY_CARE_PROVIDER_SITE_OTHER): Payer: Medicare Other | Admitting: Psychiatry

## 2009-04-07 ENCOUNTER — Ambulatory Visit (INDEPENDENT_AMBULATORY_CARE_PROVIDER_SITE_OTHER): Payer: Medicare Other | Admitting: Psychiatry

## 2009-04-07 VITALS — BP 112/68 | HR 77 | Temp 96.6°F | Ht 74.0 in | Wt 149.1 lb

## 2009-04-07 DIAGNOSIS — B20 Human immunodeficiency virus [HIV] disease: Secondary | ICD-10-CM

## 2009-04-21 NOTE — Progress Notes (Signed)
[facility] Department of Medicine  [address]  [place], [place] [address]      PROGRESS NOTE    PATIENT NAME: Mike [name], Mike [name]  CHART NUMBER: [id number]  DATE OF BIRTH: 09-24-1960  DATE OF SERVICE: 04/14/2009    The patient has declined therapy services.      Abram Sander, LICSW  Clinical Therapist, [facility] Department of Medicine, Section of Infectious Diseases    [id number]; D: 04/14/2009 09:55:45; T: 04/15/2009 13:08:65

## 2009-07-07 ENCOUNTER — Ambulatory Visit
Admission: RE | Admit: 2009-07-07 | Discharge: 2009-07-07 | Disposition: A | Discharge status: Home / Self Care | Payer: Medicare Other | Attending: Infectious Disease | Admitting: Infectious Disease

## 2009-07-07 ENCOUNTER — Ambulatory Visit (INDEPENDENT_AMBULATORY_CARE_PROVIDER_SITE_OTHER): Payer: Medicare Other | Admitting: Infectious Disease

## 2009-07-07 ENCOUNTER — Encounter (INDEPENDENT_AMBULATORY_CARE_PROVIDER_SITE_OTHER): Payer: Self-pay | Admitting: Infectious Disease

## 2009-07-07 DIAGNOSIS — E785 Hyperlipidemia, unspecified: Secondary | ICD-10-CM | POA: Insufficient documentation

## 2009-07-07 DIAGNOSIS — B20 Human immunodeficiency virus [HIV] disease: Secondary | ICD-10-CM | POA: Insufficient documentation

## 2009-07-07 DIAGNOSIS — Z131 Encounter for screening for diabetes mellitus: Secondary | ICD-10-CM | POA: Insufficient documentation

## 2009-07-07 DIAGNOSIS — R351 Nocturia: Secondary | ICD-10-CM | POA: Insufficient documentation

## 2009-07-07 DIAGNOSIS — Z125 Encounter for screening for malignant neoplasm of prostate: Secondary | ICD-10-CM | POA: Insufficient documentation

## 2009-07-07 LAB — CD4/CD8
ABSOLUTE CD4 COUNT: 486 {cells}/uL (ref 382–1614)
ABSOLUTE CD8 COUNT: 670 {cells}/uL (ref 157–813)
ABSOLUTE T LYMPH COUNT: 1140 {cells}/uL (ref 892–2436)
CD3: 73 %
CD4: 31 %
CD4:CD8 RATIO: 0.7 — ABNORMAL LOW (ref 1.0–3.4)
CD8: 43 %

## 2009-07-07 LAB — CBC/DIFF
BASOPHILS: 1 % (ref 0–1)
BASOS ABS: 0.059 THOU/uL (ref 0.0–0.2)
EOS ABS: 0.116 THOU/uL (ref 0.1–0.3)
EOSINOPHIL: 1 % (ref 1–6)
HGB: 18.2 g/dL — ABNORMAL HIGH (ref 13.1–17.3)
LYMPHS ABS: 1.66 THOU/uL (ref 1.0–4.8)
MCH: 29.9 pg (ref 27.4–33.0)
MCHC: 33.6 g/dL (ref 31.6–35.5)
MCV: 89.1 fL (ref 82.0–99.0)
MONOCYTES: 7 % (ref 4–13)
MONOS ABS: 0.709 THOU/uL (ref 0.1–0.9)
NRBC'S: 0 /100{WBCs}
PLATELET COUNT: 170 THO/UL (ref 140–450)
RDW: 12.7 % (ref 10.2–14.0)
WBC: 10.8 THOU/UL (ref 3.5–11.0)

## 2009-07-07 LAB — GLUCOSE FASTING: GLUCOSE, FASTING: 94 mg/dL (ref 70–105)

## 2009-07-07 LAB — BUN
BUN/CREAT RATIO: 11 (ref 6–22)
BUN: 10 mg/dL (ref 8–26)

## 2009-07-07 LAB — AST (SGOT): AST (SGOT): 20 U/L (ref 8–48)

## 2009-07-07 LAB — LIPID PANEL
CHOLESTEROL: 162 mg/dL (ref <200)
HDL-CHOLESTEROL: 39 mg/dL — ABNORMAL LOW (ref >39)
LDL (CALCULATED): 103 mg/dL — ABNORMAL HIGH (ref <100)
NON - HDL (CALCULATED): 123 mg/dL (ref <190)
TRIGLYCERIDES: 100 mg/dL (ref <150)
VLDL (CALCULATED): 20 mg/dL (ref <30)

## 2009-07-07 LAB — CREATININE WITH EGFR
CREATININE: 0.91 mg/dL (ref 0.62–1.27)
ESTIMATED GLOMERULAR FILTRATION RATE: >59 ml/min/1.73m2 (ref >59)

## 2009-07-07 LAB — BILIRUBIN TOTAL: BILIRUBIN, TOTAL: 0.7 mg/dL (ref 0.3–1.3)

## 2009-07-07 LAB — ALK PHOS (ALKALINE PHOSPHATASE): ALKALINE PHOSPHATASE: 54 U/L (ref 38–126)

## 2009-07-07 LAB — ALT (SGPT): ALT (SGPT): 14 U/L (ref 7–55)

## 2009-07-07 MED ORDER — ALBUTEROL 90 MCG/ACTUATION AEROSOL INHALER
2.00 | INHALATION_SPRAY | Freq: Four times a day (QID) | RESPIRATORY_TRACT | Status: DC | PRN
Start: 2009-07-07 — End: 2010-04-14

## 2009-07-07 NOTE — Progress Notes (Signed)
Medical Nutrition Therapy  I:   Notified that patient has had weight loss.  P:  Patient declined nutritional consultation.  Provided patient with handout listing ways to incorporate high                    calorie/high protein food in diet.  Staff also provided patient with prescription for nutritional supplements.       Will follow prn.  Please notify if status/needs change.  Thank you.    Annye Rusk, RDLD 07/07/2009, 11:09 AM

## 2009-07-07 NOTE — Progress Notes (Addendum)
S:  Pt here for follow up of HIV .  Patient states he is doing well from an HIV standpoint. He is taking Atripla daily, and he states he is not missing any doses.    Patient does states that his depression is not very well controlled. Patient states that since last visit 4 months ago, he has had some worsening financial difficulties, which is making his depression worse. He has lost his food stamps and is getting less overall support from the government. Patient denies suicidal or homicidal ideations.    Patient states his asthma is overall well controlled with Advair and Singulair. However, he does occasionally wheeze and feels like he would benefit from an albuterol rescue inhaler. Today, patient is breathing without difficulty. Patient continues to smoke 2 packs of cigarettes daily.    Patient states he has been sick over the past week. He has had a cough and sore throat. Patient was seen at an Urgent Care center w/in the past week, and was given Augmentin for his illness. Patient states he is overall getting better. His sore throat has almost resolved. His cough does continue. Patient denies any fevers.    Patient's appetite has been poor. He eats only once daily. Patient thinks this may be due to his poor mood.     Current outpatient prescriptions   Medication Sig    albuterol sulfate (PROVENTIL/VENTOLIN) 90 mcg/Actuation Inhl oral inhaler take 2 Puffs by inhalation Every 6 hours as needed     efavirenz-emtricitabine-tenofovir (ATRIPLA) 600-200-300 mg Tab take 1 Tab by mouth QAM.     pregabalin (LYRICA) 50 mg Cap take 1 Cap by mouth Three times a day as needed     montelukast (SINGULAR) 10 mg Tab take 1 Tab by mouth Once a day.     ADVAIR HFA INHL take 1 Puff by inalation Twice daily.        Filed Vitals:    07/07/2009  9:38 AM   BP: 106/78   Pulse: 92   Temp: 36.2 C (97.2 F)   TempSrc: Tympanic   Height: 1.88 m (6\' 2" )   Weight: 63.957 kg (141 lb)   SpO2: 96%     Patient alert and oriented, NAD  Alert  and oriented, NAD  HEENT: Pharynx clear with no inflammation or tonsillar exudate. There is no tender cervical adenopathy.   Heart: Regular, no murmurs  Lungs: CTAB, w/ exception of some occasional wheezes and decreased breath sounds, right posterior fields worse than left fields.   Lower extremities: Nontender to palpation. No edema, no erythema.      Healthcare Maint and Labs:  HIV viral load: 02/2009: < 50  CD4: 02/2009: 727  TB Quantiferon Test: 02/2009: Negative  Serum syphilis test: 02/2009: Negative  Pneumovax: 06/2009  Adacel: 06/2009  Flu shot: Fall/2010  Colonoscopy: Patient will think about it  Prostate Cancer screening: PSA ordered    A-  1. 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  2. Nicotine dependence, not wishing to stop smoking  3. Anxiety and depression.  4. Asthma- stable.  5. Cervical spine disease  6. Peripheral neuropathy painful    Plans -  1. Continue Atripla 1 per day, also continue other meds. Routine labs ordered today - including CD4 ct, HIV viral load, urine for GC and Chlamydia PCR.  Will call pt if positive. RPR , Quantiferon test done in 02/2009. Advised to use condoms whenever sexually active; patient does not do this. Will discuss dental appointment next  time.   2. Patient will let us know when he is interested in stopping smoking.   3. Patient saw Dr. Huel Cote briefly in clinic today. He agrees to a followup visit.   4. Asthma: Reasonably well controlled on Advair and Singulair. Will give patient an albuterol inhaler which he can use prn.   5. Stable  6. Stable    Kelli Churn, MD 07/07/2009, 11:34 AM      I saw and examined the patient.  I reviewed the resident's note.  I agree with the findings and plan of care as documented in the resident's note.  Any exceptions/additions are edited/noted.    [name], MD 07/08/2009, 12:01 AM

## 2009-07-08 ENCOUNTER — Encounter (INDEPENDENT_AMBULATORY_CARE_PROVIDER_SITE_OTHER): Payer: Medicare Other | Admitting: Infectious Disease

## 2009-07-08 LAB — NEISSERIA GONORRHOEAE DNA BY PCR

## 2009-07-08 LAB — CHLAMYDIA TRACHOMITIS DNA BY PCR (INHOUSE)

## 2009-07-08 NOTE — Progress Notes (Signed)
I saw and evaluated the patient. I reviewed the resident's note. I agree with the findings and plan of care as documented in the resident's note. Any exceptions/additions are noted.      [name], MD 07/08/2009, 12:02 AM

## 2009-07-09 LAB — HIV-1 RNA QUANTITATIVE PCR, PLASMA: HIV ULTRA VIRAL LOAD (PCR): 59 {copies}/mL

## 2009-07-30 ENCOUNTER — Other Ambulatory Visit (INDEPENDENT_AMBULATORY_CARE_PROVIDER_SITE_OTHER): Payer: Self-pay | Admitting: Infectious Disease

## 2009-07-30 NOTE — Progress Notes (Signed)
Patient has VL of 59.  Ordered repeat labs for CBC/diff, CD4 count, & VL.  Will notify patient.

## 2009-08-11 ENCOUNTER — Ambulatory Visit (INDEPENDENT_AMBULATORY_CARE_PROVIDER_SITE_OTHER): Payer: Medicare Other | Admitting: Psychiatry

## 2009-08-11 VITALS — BP 118/72 | HR 80 | Temp 97.9°F | Ht 74.0 in | Wt 146.6 lb

## 2009-08-11 DIAGNOSIS — F329 Major depressive disorder, single episode, unspecified: Secondary | ICD-10-CM

## 2009-08-11 DIAGNOSIS — F3289 Other specified depressive episodes: Secondary | ICD-10-CM

## 2009-08-11 DIAGNOSIS — B20 Human immunodeficiency virus [HIV] disease: Secondary | ICD-10-CM

## 2009-08-11 MED ORDER — MIRTAZAPINE 15 MG TABLET
15.00 mg | ORAL_TABLET | Freq: Every evening | ORAL | Status: DC
Start: 2009-08-11 — End: 2010-05-04

## 2009-08-11 NOTE — Progress Notes (Signed)
See dictated new patient eval.
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
## 2009-09-08 ENCOUNTER — Encounter (INDEPENDENT_AMBULATORY_CARE_PROVIDER_SITE_OTHER): Payer: Medicare Other | Admitting: Psychiatry

## 2009-10-01 ENCOUNTER — Encounter (INDEPENDENT_AMBULATORY_CARE_PROVIDER_SITE_OTHER): Payer: Self-pay

## 2009-10-28 ENCOUNTER — Other Ambulatory Visit (INDEPENDENT_AMBULATORY_CARE_PROVIDER_SITE_OTHER): Payer: Self-pay | Admitting: Infectious Disease

## 2009-10-28 MED ORDER — EFAVIRENZ 600 MG-EMTRICITABINE 200 MG-TENOFOVIR DISOPROX 300 MG TABLET
1.0000 | ORAL_TABLET | Freq: Every morning | ORAL | Status: DC
Start: 2009-10-28 — End: 2010-04-14

## 2009-10-28 MED ORDER — ADVAIR HFA INHL
1.0000 | Freq: Two times a day (BID) | RESPIRATORY_TRACT | Status: DC
Start: 2009-10-28 — End: 2009-12-09

## 2009-10-28 MED ORDER — PREGABALIN 50 MG CAPSULE
50.0000 mg | ORAL_CAPSULE | Freq: Three times a day (TID) | ORAL | Status: DC | PRN
Start: 2009-10-28 — End: 2010-05-17

## 2009-10-28 MED ORDER — MONTELUKAST 10 MG TABLET
10.0000 mg | ORAL_TABLET | Freq: Every day | ORAL | Status: DC
Start: 2009-10-28 — End: 2010-04-14

## 2009-10-28 NOTE — Telephone Encounter (Signed)
Received pharmacy requests for refills completed by Dr. Sherrie Mustache.    Due to meaningful use, medications placed as e-RX except for Lyrica (faxed to pharmacy, unable to e-RX)    Please approve in [place].

## 2009-11-24 ENCOUNTER — Encounter (INDEPENDENT_AMBULATORY_CARE_PROVIDER_SITE_OTHER): Payer: Medicare Other | Admitting: Infectious Disease

## 2009-12-08 ENCOUNTER — Ambulatory Visit (INDEPENDENT_AMBULATORY_CARE_PROVIDER_SITE_OTHER): Payer: Medicare Other | Admitting: Infectious Disease

## 2009-12-08 ENCOUNTER — Encounter (INDEPENDENT_AMBULATORY_CARE_PROVIDER_SITE_OTHER): Payer: Self-pay | Admitting: Infectious Disease

## 2009-12-08 VITALS — BP 112/70 | HR 77 | Temp 96.7°F | Ht 74.0 in | Wt 141.6 lb

## 2009-12-08 MED ORDER — TRAMADOL 50 MG TABLET
50.00 mg | ORAL_TABLET | Freq: Four times a day (QID) | ORAL | Status: DC | PRN
Start: 2009-12-08 — End: 2010-04-14

## 2009-12-08 NOTE — Progress Notes (Addendum)
HIV Medical Nutrition Therapy    Name: Mike [name]  Date of Service: 12/08/2009    Reason for assessment: possible malnutrition based on BMI of 18.1    SUBJECTIVE  Mike [name] initially seemed interested in adding calories and nutrients into his diet; however, after further discussion he stated that he wouldn't be incorporating physical activity and stated that he "didn't have anything to live for" and that he'd had a good life and didn't seem interested in making lifestyle changes.     Weight loss is a concern and we discussed incorporating lower-cost fruits and vegetables to increase the nutrient density of his diet. Suggested adding powdered milk to soups, casseroles, and to the hamburger helper that he reports frequently preparing.     Mike [name] said that he has reached out to his neighbor for help with meals. She is preparing several meals per week for him. Also reported that when he has Ensure he will drink several at a time; we discussed the benefits of spacing them out rather than consuming them all at once.     Appetite: Fair  Gastrointestinal:   Chewing/swallowing - chewing difficulties due to poor dentition  Nausea/Vomiting - none reported    Dietary Variety: Fair    OBJECTIVE  Ht: 1.37m   Wt: 64 kg    IBW: 86 kg    % IBW: 74 %   Adj BW: ---- kg  BMI: 18.1 Under weight (BMI less than 18.5)  UBW: 75 kg in '94 (before diagnosis)     %UBW : 85 %     Weight Change: -15% in 15 years; -6% in one year    Abnormal Lab Values: LDL 103, HDL 39    Vitamins/Minerals: multivitamin/multimineral     Dietary Supplements: Ensure when provided by [employer]. Reports drinking three in one sitting, which I advised against.       ASSESSMENT   Estimated Needs:  Calories: 35-40  cal/kg 64 =  2200-2500 calories/day  Protein:  1-1.2  g/kg 64 =  64-77  grams protein/day  Fluid : 2000    Goals:  Maintain Weight of 64kg    Monitor / Evaluate: Monitor: Oral Intake, Supplement Intake, Weight Status and Pertinent Labs HDL and  LDL  Provide Education on: adding calories and nutrition into the diet    Comments: Pt is largely unwilling to make diet or physical activity changes at this time.      PLAN / INTERVENTION  Recommend:   1) 2200-2500 calories and at least 64 grams of protein  2) Increase physical activity to 30 minutes per day  3) Add calories using powdered milk, add low-cost nutrients with canned/dried fruits and vegetables    Nutrition Diagnosis: Unspecified Protein-Calorie Malnutrition ; related to Inadequate energy intake and other increased needs secondary to HIV ; as evidenced by Weight loss of -6% body weight in one year, evident muscle wasting, and BMI of 18.1    [name], [place], [place], LD 12/08/2009, 10:39 AM    [name] MD

## 2009-12-08 NOTE — Progress Notes (Addendum)
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  Denies suicidal thoughts.  Using marijuana but no other drugs or alcohol.  Took only 1 dose of Remeron and stopped it because of severe drowsiness.  Eating poorly, lack of money, and losing weight.  No fevers.  Request Advair discus refill.  Had a pulmonary exacerbation in March, better now.  Still smoking and not interested in quitting.  Sexually active with male partner who also has HIV.  Will not use condoms.  He and his partner have discussed this issue.    States that Lyrica not helping his pain at all, and states that only narcotics help.    Takes 100% of doses of his HIV meds.    Other ROS neg.    Current outpatient prescriptions   Medication Sig   . tramadol (ULTRAM) 50 mg Tab take 1 Tab by mouth Every 6 hours as needed.   Marland Kitchen efavirenz-emtricitabine-tenofovir (ATRIPLA) 600-200-300 mg Tab take 1 Tab by mouth QAM.   . pregabalin (LYRICA) 50 mg Cap take 1 Cap by mouth Three times a day as needed.   . montelukast (SINGULAIR) 10 mg Tab take 1 Tab by mouth Once a day.   Marland Kitchen FLUTICASONE/SALMETEROL (ADVAIR HFA INHL) take 1 Puff by inhalation Twice daily.   . mirtazapine (REMERON) 15 mg Tab take 1 Tab by mouth every night.    Marland Kitchen albuterol sulfate (PROVENTIL/VENTOLIN) 90 mcg/Actuation Inhl oral inhaler take 2 Puffs by inhalation Every 6 hours as needed      Note:  Pt is NOT taking Remeron now.        BP 112/70   Pulse 77   Temp(Src) 35.9 C (96.7 F) (Tympanic)   Ht 1.88 m (6\' 2" )   Wt 64.229 kg (141 lb 9.6 oz)   BMI 18.18 kg/m2   SpO2 96%  Appears stable but thin.    Eyes- EOMI, PERL.  Conjunctivae clear.    Oro- normal mucosa, no lesions.]  No cervical lymph nodes palp.  Lungs - clear to P and A.  Heart - RRR, normal S1 and S2, no murmurs.  Pulses- all full and =.   Abd - normal BS, non tender and no organomegaly.  No masses. Abdomen scaphoid, thin.  Extrem - no edema, no clubbing  Neuro - normal strength throughout.    Psych - dysthmic mood    Hospital Outpatient Visit on 07/07/2009   Component Date Value Range Status   . WBC (THOU/UL) 07/07/2009 10.8  3.5-11.0 Final   . RBC (MIL/uL) 07/07/2009 6.10* 4.46-5.70 Final   . HGB (g/dL) 40/98/1191 47.8* 29.5-62.1 Final   . HCT (%) 07/07/2009 54.3* 39.8-50.2 Final   . MCV (fL) 07/07/2009 89.1  82.0-99.0 Final   . MCH (pg) 07/07/2009 29.9  27.4-33.0 Final   . MCHC (g/dL) 30/86/5784 69.6  29.5-28.4 Final   . RDW (%) 07/07/2009 12.7  10.2-14.0 Final   . PLATELET COUNT (THO/UL) 07/07/2009 170  140-450 Final   . MPV (FL) 07/07/2009 9.2  7.4-10.4 Final   . PMN'S (%) 07/07/2009 76* 40-75 Final   . PMN ABS (THOU/uL) 07/07/2009 8.220* 1.5-7.7 Final   . LYMPHOCYTES (%) 07/07/2009 15* 20-45 Final   . LYMPHS ABS (THOU/uL) 07/07/2009 1.660  1.0-4.8 Final   .  MONOCYTES (%) 07/07/2009 7  4-13 Final   . MONOS ABS (THOU/uL) 07/07/2009 0.709  0.1-0.9 Final   . EOSINOPHIL (%) 07/07/2009 1  1-6 Final   . EOS ABS (THOU/uL) 07/07/2009 0.116  0.1-0.3 Final   . BASOPHILS (%) 07/07/2009 1  0-1 Final   . BASOS ABS (THOU/uL) 07/07/2009 0.059  0.0-0.2 Final   . NRBC'S (/100WBC) 07/07/2009 0  0 Final   . GLUCOSE, FASTING (mg/dL) 57/84/6962 94  95-284 Final   . AST (SGOT) (U/L) 07/07/2009 20  8-48 Final   . ALT (SGPT) (U/L) 07/07/2009 14  7-55 Final   . ALKALINE PHOSPHATASE (U/L) 07/07/2009 54  38-126 Final   . BUN (mg/dL) 13/24/4010 10  2-72 Final   . BUN/CREAT RATIO  07/07/2009 11  6-22 Final   . CREATININE (mg/dL) 53/66/4403 4.74  2.59-5.63 Final   . ESTIMATED GLOMERULAR FILTRATION RA* (ml/min/1.35m2) 07/07/2009 >59  >59 Final    Comment: IF PATIENT IS AFRICAN AMERICAN MULTIPLY RESULT BY 1.210                           (NOTE)                           Chronic Kidney Disease Stages based on GFR                           Stage 1   Normal GFR                            Stage 2   60 to 89                           Stage 3   30 to 59                           Stage 4   15 to 29                           Stage 5   <15   . CD3 (%) 07/07/2009 73   Final   . CD4 (%) 07/07/2009 31   Final   . CD8 (%) 07/07/2009 43   Final   . ABSOLUTE T LYMPH COUNT (cells/ul) 07/07/2009 1140  [phone] Final   . ABSOLUTE CD4 COUNT (cells/ul) 07/07/2009 486  [phone] Final   . ABSOLUTE CD8 COUNT (cells/ul) 07/07/2009 670  157-813 Final   . CD4:CD8 RATIO  07/07/2009 0.7* 1.0-3.4 Final   . HIV ULTRA VIRAL LOAD (PCR) (COPIES/mL) 07/07/2009 59   Final    Comment: Performed by PCR using the [employer] Ultrasensitive Amplicor HIV-1                           Monitor Test. This test is not approved for and should not be used as                           a screening test for HIV-1 or as a diagnostic test to confirm or  exclude the presence of HIV-1 infection.                           According to manufacturer recommendation, sensitivity is 50 to                           100,000 copies per/mL.                           Test developed and performance validated at [facility] using an FDA cleared                           assay.                           These results are disclosed to you from records whose confidentiality                           is protected by laws that prohibit further disclosure of the                           information without written consent of the person to whom it                           pertains, or as otherwise permitted by law.  General authorization                           for the release of medical or other information is not sufficient.   . SPECIMEN SITE  07/07/2009 URINE   Final   . CHLAMYDIA TRACHOMATIS  07/07/2009    Final                    Value:Negative. Chlamydia trachomatis DNA NOT DETECTED by Amplicor PCR. A                          negative result does not exclude C. trachomatis infection.                           Test developed and performance validated at [facility] using an FDA cleared                          assay.   Marland Kitchen SPECIMEN SITE  07/07/2009 URINE   Final   . NEISSERIA GONORRHOEAE  07/07/2009    Final                    Value:Negative. Neisseria gonorrhoeae DNA NOT DETECTED by Amplicor PCR. A                          negative result does not exclude N. gonorrhoeae infection.                          Test developed and performance validated at [facility] using an FDA cleared  assay.   Marland Kitchen BILIRUBIN, TOTAL (mg/dL) 16/04/9603 0.7  5.4-0.9 Final   . TRIGLYCERIDES (mg/dL) 81/19/1478 295  <621 Final   . CHOLESTEROL (mg/dL) 30/86/5784 696  <295 Final   . HDL-CHOLESTEROL (mg/dL) 28/41/3244 39* >01 Final   . LDL (CALCULATED) (mg/dL) 02/72/5366 440* <347 Final   . VLDL (CALCULATED) (mg/dL) 42/59/5638 20  <75 Final   . NON - HDL (CALCULATED) (mg/dL) 64/33/2951 884  <166 Final    Comment: Non-HDL Reference Ranges:                                       Desirable with known CAD or CAD risk equivalent: <130                           mg/dL                           Desirable with moderate CAD risk: <160 mg/dL                           Desirable with mild CAD risk: <190 mg/dL   . PROSTATE SPECIFIC AG (ng/mL) 07/07/2009 1.0  0.0-4.0 Final    Comment: (NOTE)                           Chemiluminescent immunoassay performed on [employer] DXI using                            "[employer]" Standardization.       A:  HIV infection- doing well on current meds.  !00% adherence  Depression/anxiety - currently not on any meds  Nicotine dependence. No ready to cut down or stop  Asthma  Weight loss and inadequate diet    1. Continue same HIV meds - Atripla one tab every day  2. Pt declines any psychiatric treatments or referrals at this time.  He denies suicidal thoughts.   3. Labs today - CD4 count, HIV viral load, other routine labs  4. Counseled pt to always use condoms when he is sexually active.   5. Advair discus for asthma and Singulair  6. Trial of Ultram prescribed - 100 mg p o every 6 hours prn pain  7. Consult with Dietician today  8. RTC in a few months  9. script for Ensure supplement given to take to [facility]    [name] MD

## 2009-12-09 ENCOUNTER — Other Ambulatory Visit (INDEPENDENT_AMBULATORY_CARE_PROVIDER_SITE_OTHER): Payer: Self-pay | Admitting: Infectious Disease

## 2009-12-09 MED ORDER — FLUTICASONE 250 MCG-SALMETEROL 50 MCG/DOSE BLISTR POWDR FOR INHALATION
1.00 | DISK | Freq: Two times a day (BID) | RESPIRATORY_TRACT | Status: DC
Start: 2009-12-09 — End: 2010-04-14

## 2009-12-09 NOTE — Telephone Encounter (Signed)
rec'd fax request for refill.  Due to meaningful use, med placed as e-RX.    Pt medication list had incorrect order.  Placed as e-RX so that medication list is updated.    Please approve.

## 2009-12-15 ENCOUNTER — Encounter (INDEPENDENT_AMBULATORY_CARE_PROVIDER_SITE_OTHER): Payer: Medicare Other | Admitting: Psychiatry

## 2010-04-14 ENCOUNTER — Other Ambulatory Visit (INDEPENDENT_AMBULATORY_CARE_PROVIDER_SITE_OTHER): Payer: Self-pay | Admitting: Infectious Disease

## 2010-04-14 MED ORDER — ALBUTEROL 90 MCG/ACTUATION AEROSOL INHALER
2.0000 | INHALATION_SPRAY | Freq: Four times a day (QID) | RESPIRATORY_TRACT | Status: DC | PRN
Start: 2010-04-14 — End: 2010-11-15

## 2010-04-14 MED ORDER — FLUTICASONE 250 MCG-SALMETEROL 50 MCG/DOSE BLISTR POWDR FOR INHALATION
1.0000 | DISK | Freq: Two times a day (BID) | RESPIRATORY_TRACT | Status: DC
Start: 2010-04-14 — End: 2011-05-02

## 2010-04-14 MED ORDER — TRAMADOL 50 MG TABLET
50.0000 mg | ORAL_TABLET | Freq: Four times a day (QID) | ORAL | Status: DC | PRN
Start: 2010-04-14 — End: 2010-07-21

## 2010-04-14 MED ORDER — MONTELUKAST 10 MG TABLET
10.0000 mg | ORAL_TABLET | Freq: Every day | ORAL | Status: DC
Start: 2010-04-14 — End: 2010-11-15

## 2010-04-14 MED ORDER — EFAVIRENZ 600 MG-EMTRICITABINE 200 MG-TENOFOVIR DISOPROX 300 MG TABLET
1.0000 | ORAL_TABLET | Freq: Every evening | ORAL | Status: DC
Start: 2010-04-14 — End: 2010-11-15

## 2010-04-14 NOTE — Telephone Encounter (Signed)
Please approve and sign script(s) to be e-rx'd to preferred pharmacy.  Thank you.

## 2010-04-14 NOTE — Telephone Encounter (Signed)
Message copied by Providence Lanius on Thu Apr 14, 2010  2:48 PM  ------       Message from: Konrad Felix       Created: Thu Apr 14, 2010  1:15 PM         >> Inetta Fermo [facility] 04/14/2010 01:15 PM       Dr. Sherrie Mustache pt-       Patient called stating that his prescriptions have expired and he needs new ones for (1) efavirenz-emtricitabine-tenofovir (ATRIPLA) 600-200-300 mg Tab,  (2) montelukast (SINGULAIR) 10 mg Tab, (3)  fluticasone-salmeterol (ADVAIR) 250-50 mcg/dose DsDv oral diskus inhaler, (4) albuterol sulfate (PROVENTIL/VENTOLIN) 90 mcg/Actuation Inhl oral inhaler, (5)   tramadol (ULTRAM) 50 mg Tab.  Please send to [facility] [place], [place] - [address]              Phone: [phone] Fax: [phone]

## 2010-05-04 ENCOUNTER — Ambulatory Visit (INDEPENDENT_AMBULATORY_CARE_PROVIDER_SITE_OTHER): Payer: Medicare Other | Admitting: Infectious Disease

## 2010-05-04 VITALS — BP 118/80 | HR 86 | Temp 96.8°F | Ht 74.0 in | Wt 142.3 lb

## 2010-05-04 NOTE — Progress Notes (Signed)
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  No specific complaints today except neck pain the same.  Stopped the Lyrica because it made him dizzy and not helping much either.  Tramadol does help.  No fevers.  Concerned about his weight which is actually up about 1 to 2 lbs from last time.  Feels that he is not depressed right now, and does not wish to be on any meds for this.   100% adherent to HIV regimen.  He is sexually active with a partner of about 18 months who also has HIV infection.    They do not use condoms.     Other ROS neg.    Current outpatient prescriptions   Medication Sig   . efavirenz-emtricitabine-tenofovir (ATRIPLA) 600-200-300 mg Oral Tablet take 1 Tab by mouth every night.   . montelukast (SINGULAIR) 10 mg Oral Tablet take 1 Tab by mouth Once a day.   . fluticasone-salmeterol (ADVAIR) 250-50 mcg/dose Inhalation Disk with Device oral diskus inhaler take 1 INHALATION by inhalation Twice daily.   Marland Kitchen albuterol sulfate (PROVENTIL/VENTOLIN) 90 mcg/Actuation Inhl oral inhaler take 2 Puffs by inhalation Every 6 hours as needed.   . tramadol (ULTRAM) 50 mg Oral Tablet take 1 Tab by mouth Every 6 hours as needed.   . pregabalin (LYRICA) 50 mg Cap take 1 Cap by mouth Three times a day as needed.   Marland Kitchen DISCONTD: mirtazapine (REMERON) 15 mg Tab take 1 Tab by mouth every night.        BP 118/80   Pulse 86   Temp(Src) 36 C (96.8 F) (Tympanic)   Ht 1.88 m (6\' 2" )   Wt 64.547 kg (142 lb 4.8 oz)   BMI 18.27 kg/m2   SpO2 97%  Appears well and stable  No icterus  EO:MI, PERL  No adenopathy in the neck  Oro- no lesions  Lungs - clear to P and A  Heart - RRR, normal S1 and S2, no murmurs  Abd - normal BS, non tender and no organomegaly  Extrem- no edema  Neuro- normal gait, strength    No visits with results within 5 Month(s) from this visit.   Latest known visit with results is:    Hospital Outpatient Visit on 07/07/2009   Component Date Value Range Status   . WBC (THOU/UL) 07/07/2009 10.8  3.5-11.0 Final   . RBC (MIL/uL) 07/07/2009 6.10* 4.46-5.70 Final   . HGB (g/dL) 16/04/9603 54.0* 98.1-19.1 Final   . HCT (%) 07/07/2009 54.3* 39.8-50.2 Final   . MCV (fL) 07/07/2009 89.1  82.0-99.0 Final   . MCH (pg) 07/07/2009 29.9  27.4-33.0 Final   . MCHC (g/dL) 47/82/9562 13.0  86.5-78.4 Final   . RDW (%) 07/07/2009 12.7  10.2-14.0 Final   . PLATELET COUNT (THO/UL) 07/07/2009 170  140-450 Final   . MPV (FL) 07/07/2009 9.2  7.4-10.4 Final   . PMN'S (%) 07/07/2009 76* 40-75 Final   . PMN ABS (THOU/uL) 07/07/2009 8.220* 1.5-7.7 Final   . LYMPHOCYTES (%) 07/07/2009 15* 20-45 Final   . LYMPHS ABS (THOU/uL) 07/07/2009 1.660  1.0-4.8 Final   . MONOCYTES (%) 07/07/2009 7  4-13 Final   . MONOS ABS (THOU/uL) 07/07/2009 0.709  0.1-0.9 Final   . EOSINOPHIL (%) 07/07/2009 1  1-6 Final   . EOS ABS (  THOU/uL) 07/07/2009 0.116  0.1-0.3 Final   . BASOPHILS (%) 07/07/2009 1  0-1 Final   . BASOS ABS (THOU/uL) 07/07/2009 0.059  0.0-0.2 Final   . NRBC'S (/100WBC) 07/07/2009 0  0 Final   . GLUCOSE, FASTING (mg/dL) 16/04/9603 94  54-098 Final   . AST (SGOT) (U/L) 07/07/2009 20  8-48 Final   . ALT (SGPT) (U/L) 07/07/2009 14  7-55 Final   . ALKALINE PHOSPHATASE (U/L) 07/07/2009 54  38-126 Final   . BUN (mg/dL) 11/91/4782 10  9-56 Final   . BUN/CREAT RATIO  07/07/2009 11  6-22 Final   . CREATININE (mg/dL) 21/30/8657 8.46  9.62-9.52 Final   . ESTIMATED GLOMERULAR FILTRATION RA* (ml/min/1.51m2) 07/07/2009 >59  >59 Final    Comment: IF PATIENT IS AFR[employer] AMERICAN MULTIPLY RESULT BY 1.210                           (NOTE)                           Chronic Kidney Disease Stages based on GFR                           Stage 1   Normal GFR                           Stage 2   60 to 89                           Stage 3   30 to 59                           Stage 4   15 to 29                            Stage 5   <15   . CD3 (%) 07/07/2009 73   Final   . CD4 (%) 07/07/2009 31   Final   . CD8 (%) 07/07/2009 43   Final   . ABSOLUTE T LYMPH COUNT (cells/ul) 07/07/2009 1140  [phone] Final   . ABSOLUTE CD4 COUNT (cells/ul) 07/07/2009 486  [phone] Final   . ABSOLUTE CD8 COUNT (cells/ul) 07/07/2009 670  157-813 Final   . CD4:CD8 RATIO  07/07/2009 0.7* 1.0-3.4 Final   . HIV ULTRA VIRAL LOAD (PCR) (COPIES/mL) 07/07/2009 59   Final    Comment: Performed by PCR using the [employer] Ultrasensitive Amplicor HIV-1                           Monitor Test. This test is not approved for and should not be used as                           a screening test for HIV-1 or as a diagnostic test to confirm or                           exclude the presence of HIV-1 infection.  According to manufacturer recommendation, sensitivity is 50 to                           100,000 copies per/mL.                           Test developed and performance validated at [facility] using an FDA cleared                           assay.                           These results are disclosed to you from records whose confidentiality                           is protected by laws that prohibit further disclosure of the                           information without written consent of the person to whom it                           pertains, or as otherwise permitted by law.  General authorization                           for the release of medical or other information is not sufficient.   . SPECIMEN SITE  07/07/2009 URINE   Final   . CHLAMYDIA TRACHOMATIS  07/07/2009    Final                    Value:Negative. Chlamydia trachomatis DNA NOT DETECTED by Amplicor PCR. A                          negative result does not exclude C. trachomatis infection.                          Test developed and performance validated at [facility] using an FDA cleared                          assay.   Marland Kitchen SPECIMEN SITE  07/07/2009 URINE   Final    . NEISSERIA GONORRHOEAE  07/07/2009    Final                    Value:Negative. Neisseria gonorrhoeae DNA NOT DETECTED by Amplicor PCR. A                          negative result does not exclude N. gonorrhoeae infection.                          Test developed and performance validated at [facility] using an FDA cleared                          assay.   Marland Kitchen BILIRUBIN, TOTAL (mg/dL) 16/04/9603 0.7  5.4-0.9 Final   . TRIGLYCERIDES (mg/dL) 81/19/1478 295  <621 Final   . CHOLESTEROL (  mg/dL) 16/04/9603 540  <981 Final   . HDL-CHOLESTEROL (mg/dL) 19/14/7829 39* >56 Final   . LDL (CALCULATED) (mg/dL) 21/30/8657 846* <962 Final   . VLDL (CALCULATED) (mg/dL) 95/28/4132 20  <44 Final   . NON - HDL (CALCULATED) (mg/dL) 07/26/7251 664  <403 Final    Comment: Non-HDL Reference Ranges:                                       Desirable with known CAD or CAD risk equivalent: <130                           mg/dL                           Desirable with moderate CAD risk: <160 mg/dL                           Desirable with mild CAD risk: <190 mg/dL   . PROSTATE SPECIFIC AG (ng/mL) 07/07/2009 1.0  0.0-4.0 Final    Comment: (NOTE)                           Chemiluminescent immunoassay performed on [employer] Coul[employer] DXI using                            "[employer]" Standardization.       Labs - HIV viral load 59 and CD4 486    A:  HIV infection - doing well on current meds  100% adherent  Asthma stable  Depression- stable at present  Sexually active  Nicotine dependence   Cervical disc disease and chronic pain- stable    P:  Continue Atripla for HIV  Continue Tramadol for pain  Continue Ensure supplements  Encouraged pt to use condoms  Not interested in quitting smoking  Flu shot today  Pt declines visit with Psych right now - advised him to call us at once if he decides otherwise  RTC 5 months    [name], MD 05/04/2010, 3:41 PM

## 2010-05-17 ENCOUNTER — Other Ambulatory Visit (INDEPENDENT_AMBULATORY_CARE_PROVIDER_SITE_OTHER): Payer: Self-pay | Admitting: Internal Medicine

## 2010-05-17 MED ORDER — PREGABALIN 50 MG CAPSULE
50.0000 mg | ORAL_CAPSULE | Freq: Three times a day (TID) | ORAL | Status: DC | PRN
Start: 2010-05-17 — End: 2010-10-19

## 2010-05-17 NOTE — Telephone Encounter (Signed)
Fax request for refills.  Phoned in to pharmacy. Please approve.

## 2010-07-21 ENCOUNTER — Other Ambulatory Visit (INDEPENDENT_AMBULATORY_CARE_PROVIDER_SITE_OTHER): Payer: Self-pay | Admitting: Infectious Disease

## 2010-07-21 MED ORDER — TRAMADOL 50 MG TABLET
50.0000 mg | ORAL_TABLET | Freq: Four times a day (QID) | ORAL | Status: DC | PRN
Start: 2010-07-21 — End: 2010-11-09

## 2010-07-21 NOTE — Telephone Encounter (Signed)
Fax request for refill received from the preferred pharmacy.  Due to meaningful use, please approve and sign requests to be e-rx'd to preferred pharmacy.

## 2010-10-12 ENCOUNTER — Encounter (INDEPENDENT_AMBULATORY_CARE_PROVIDER_SITE_OTHER): Payer: Medicare Other | Admitting: Infectious Disease

## 2010-10-19 ENCOUNTER — Ambulatory Visit
Admission: RE | Admit: 2010-10-19 | Discharge: 2010-10-19 | Disposition: A | Discharge status: Home / Self Care | Payer: Medicare Other | Source: Ambulatory Visit | Attending: Infectious Disease | Admitting: Infectious Disease

## 2010-10-19 ENCOUNTER — Ambulatory Visit (INDEPENDENT_AMBULATORY_CARE_PROVIDER_SITE_OTHER): Payer: Medicare Other | Admitting: Infectious Disease

## 2010-10-19 ENCOUNTER — Ambulatory Visit (INDEPENDENT_AMBULATORY_CARE_PROVIDER_SITE_OTHER): Payer: Medicare Other | Admitting: Rheumatology

## 2010-10-19 VITALS — BP 110/70 | HR 60 | Temp 97.0°F | Ht 74.0 in | Wt 142.4 lb

## 2010-10-19 DIAGNOSIS — M542 Cervicalgia: Secondary | ICD-10-CM | POA: Insufficient documentation

## 2010-10-19 DIAGNOSIS — B20 Human immunodeficiency virus [HIV] disease: Secondary | ICD-10-CM | POA: Insufficient documentation

## 2010-10-19 DIAGNOSIS — J45909 Unspecified asthma, uncomplicated: Secondary | ICD-10-CM | POA: Insufficient documentation

## 2010-10-19 DIAGNOSIS — F3289 Other specified depressive episodes: Secondary | ICD-10-CM | POA: Insufficient documentation

## 2010-10-19 DIAGNOSIS — G609 Hereditary and idiopathic neuropathy, unspecified: Secondary | ICD-10-CM | POA: Insufficient documentation

## 2010-10-19 LAB — CBC/DIFF
BASOPHILS: 1 % (ref 0–1)
BASOS ABS: 0.064 THOU/uL (ref 0.0–0.2)
EOS ABS: 0.212 THOU/uL (ref 0.1–0.3)
EOSINOPHIL: 4 % (ref 1–6)
HCT: 51.6 % — ABNORMAL HIGH (ref 39.8–50.2)
HGB: 17.4 g/dL — ABNORMAL HIGH (ref 13.1–17.3)
LYMPHOCYTES: 30 % (ref 20–45)
LYMPHS ABS: 1.75 THOU/uL (ref 1.0–4.8)
MCH: 30.1 pg (ref 27.4–33.0)
MCV: 89.2 fL (ref 82.0–99.0)
MONOCYTES: 7 % (ref 4–13)
MONOS ABS: 0.407 THOU/uL (ref 0.1–0.9)
NRBC'S: 0 /100{WBCs}
PMN ABS: 3.35 THOU/uL (ref 1.5–7.7)
PMN'S: 58 % (ref 40–75)
RBC: 5.78 MIL/uL — ABNORMAL HIGH (ref 4.46–5.70)
RDW: 13.2 % (ref 10.2–14.0)
WBC: 5.8 THOU/uL (ref 3.5–11.0)

## 2010-10-19 LAB — CREATININE: CREATININE: 0.78 mg/dL (ref 0.62–1.27)

## 2010-10-19 LAB — CD4/CD8
ABSOLUTE CD4 COUNT: 597 {cells}/uL (ref 382–1614)
ABSOLUTE CD8 COUNT: 827 {cells}/uL — ABNORMAL HIGH (ref 157–813)
ABSOLUTE T LYMPH COUNT: 1431 {cells}/uL (ref 892–2436)
CD3: 80 %
CD4: 33 %
CD4:CD8 RATIO: 0.7 — ABNORMAL LOW (ref 1.0–3.4)
CD8: 46 %

## 2010-10-19 LAB — AST (SGOT): AST (SGOT): 21 U/L (ref 8–48)

## 2010-10-19 LAB — BILIRUBIN TOTAL: BILIRUBIN, TOTAL: 0.6 mg/dL (ref 0.3–1.3)

## 2010-10-19 LAB — ALT (SGPT): ALT (SGPT): 16 U/L (ref 7–55)

## 2010-10-19 LAB — CREATININE WITH EGFR: ESTIMATED GLOMERULAR FILTRATION RATE: >59 ml/min/1.73m2 (ref >59)

## 2010-10-19 LAB — ALK PHOS (ALKALINE PHOSPHATASE): ALKALINE PHOSPHATASE: 65 U/L (ref 38–126)

## 2010-10-19 LAB — BUN
BUN/CREAT RATIO: 13 (ref 6–22)
BUN: 10 mg/dL (ref 8–26)

## 2010-10-19 NOTE — H&P (Signed)
 Clinical Pharmacy Note    Mike [name] is a 50 yo M here for a f/u visit.  He hasn't had labs drawn since December 2010. When asked how many doses of medication he has missed in the last two weeks, he says he misses his at least once a week, and sometimes he will go for a week without taking his medication before he has realized. Other times he purposely doesn't take it because of the side effects when he just doesn't want to feel them. He states that he has had the same side effects no matter what regimen he has been on, including feeling out of it and high and dizzy. He said the he cannot drive when on his medications, but that they still don't really effect his day to day activities.    Labs  07/07/09  59 copies/mL  486 (31%)    Medications  Atripla one tablet once daily  Singulair   Advair     Assessment:  Patient is noncompliant with HIV. Although he understands possible repercussions of noncompliance, he is confident that his HIV has always been controlled and has not developed resistance in the past, and he doesn't think he will even if he continues to miss doses. He does not seem to take these risks seriously.  Plan:   Patient to get labs today to see if Atripla is still appropriate for this patient. Explained to patient that if he is not being followed up with labwork regularly, we can't be sure if a regimen is continuing to work. Also explained to him that just because he hasn't had problems in the past, he could still get resistance in the future and start losing medication options. He agreed to get labs today. Patient not interested in changing regimens, or alarm clocks, or pill boxes to help him remember. Will follow-up future labs and continue to work with him.     Kristie Livings, PharmD  Infectious Diseases Pharmacy Resident  [phone]

## 2010-10-19 NOTE — Progress Notes (Signed)
ID Staff    Note dictated    [name], MD 10/19/2010, 6:51 PM

## 2010-10-20 LAB — NEISSERIA GONORRHOEAE DNA BY PCR

## 2010-10-20 LAB — CHLAMYDIA TRACHOMITIS DNA BY PCR (INHOUSE)

## 2010-10-20 LAB — SERUM SYPHILIS TEST: RPR: NONREACTIVE

## 2010-10-20 NOTE — Progress Notes (Signed)
 [facility] Department of Medicine  [address]  [place], [place] [address]      PROGRESS NOTE    PATIENT NAME: Mike [name], Mike [name]  CHART NUMBER: [id number]  DATE OF BIRTH: 03/03/1961  DATE OF SERVICE: 10/19/2010    PROBLEM LIST:  1.  HIV infection.  2.  Asthma.  3.  Depression and anxiety.  4.  History of cocaine use, in remission.  5.  Peripheral neuropathy.  6.  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    SUBJECTIVE:  The patient states that his main problem right now is chronic neck pain.  It is also especially around his right shoulder.  He denies any numbness or tingling of his hands or feet but does have the chronic pain in his feet that he has had for a long time.  He also denies any bladder or bowel dysfunction.  He denies any fever.  His weight is up about 2 pounds from last time and he said he is trying to eat a little better.  He feels his depression is just the same and that starting any medication is of no use to him.  He also cannot tolerate the medications in general, and he does not wish to be on any medications for depression.  He is 100% adherent to his HIV drug regimen.    He is sexually active with a partner for almost 2 years now, who also has HIV infection.  The partner apparently is not in care and I suggested that he contact our clinic.  They do not use condoms.    Other review of systems is negative.    MEDICATIONS:    Current outpatient prescriptions   Medication Sig   . tramadol  (ULTRAM ) 50 mg Oral Tablet take 1 Tab by mouth Every 6 hours as needed.   . efavirenz -emtricitabine -tenofovir  (ATRIPLA) 600-200-300 mg Oral Tablet take 1 Tab by mouth every night.   . montelukast  (SINGULAIR ) 10 mg Oral Tablet take 1 Tab by mouth Once a day.   . fluticasone -salmeterol (ADVAIR ) 250-50 mcg/dose Inhalation Disk with Device oral diskus inhaler take 1 INHALATION by inhalation Twice daily.   . albuterol  sulfate (PROVENTIL /VENTOLIN ) 90 mcg/Actuation Inhl oral inhaler take 2 Puffs by  inhalation Every 6 hours as needed.           OBJECTIVE:  Vital signs:  BP 110/70  Pulse 60  Temp(Src) 36.1 C (97 F) (Tympanic)  Ht 1.88 m (6' 2)  Wt 64.592 kg (142 lb 6.4 oz)  BMI 18.28 kg/m2  SpO2 95%      He appears well and stable overall.  He has no scleral icterus and no jaundice of the skin.  Extraocular muscles are intact.  Oropharynx is clear.  There are no lip lesions or intraoral lesions present.  No lymphadenopathy palpable.  Lungs are clear to auscultation and percussion.  Heart:  Regular rate and rhythm, normal S1 S2, no murmurs or gallops.  Abdomen is soft, nontender, no organomegaly.  Normal bowel sounds.  No masses.  Extremities:  No edema, clubbing, or cyanosis.  His gait is normal.    LABS:  From December 2011, CD4 count is 486 and HIV viral load is 59.    ASSESSMENT:  1.  HIV infection doing very well on current regimen.  He had a slight blip of his viral load last time at 59, which is unlikely to be significant.  2.  Depression and other psych issues but stable.  No  suicidal ideation.  3.  Asthma, stable.  4.  History of cocaine use in remission.  5.  Chronic neck pain, likely due to cervical disk disease, which we have documented in the past.  6.  Peripheral neuropathy, stable.    PLAN:  1.  He will continue the same HIV regimen which includes Atripla 1 tablet a day.    2.  He will also continue his other medicines all as noted above.  3.  Return to clinic in 6 months, and I told the patient to call sooner if he needs anything.  4.  We will draw labs today, CBC, diff, and platelet count, BUN, creatinine, liver enzymes, CD4, lymphocyte count and HIV viral load.    I urged him to use condoms but he states he and his partner do not want to do this.  I also stressed the importance of having his partner come in for evaluation and management, and he will think about this.  I also encouraged him to try to eat a regular diet with 3 meals a day.      Andrea Perry, MD  Professor; Section of  Infectious Disease  [facility] of Medicine    [id number]; D: 10/19/2010 18:50:30; T: 10/20/2010 14:25:43    Andrea Perry, MD 10/20/2010, 5:59 PM

## 2010-10-21 LAB — HIV-1 RNA QUANTITATIVE PCR, PLASMA
HIV ULTRA VIRAL LOAD (PCR): <20 COPIES/mL
TS 70 LOG 10: <1.3 LOG10

## 2010-10-25 LAB — MYCOBACTERIUM TUBERCULOSIS INFECTION DETERMINATION BY QUANTIFERON-TB G

## 2010-11-09 ENCOUNTER — Other Ambulatory Visit (HOSPITAL_COMMUNITY): Payer: Self-pay | Admitting: Infectious Disease

## 2010-11-09 NOTE — Telephone Encounter (Signed)
Please approve and sign script(s) to be e-rx'd to preferred pharmacy.  Thank you.

## 2010-11-10 MED ORDER — TRAMADOL 50 MG TABLET
50.0000 mg | ORAL_TABLET | Freq: Four times a day (QID) | ORAL | Status: DC | PRN
Start: 2010-11-09 — End: 2011-02-06

## 2010-11-15 ENCOUNTER — Other Ambulatory Visit (INDEPENDENT_AMBULATORY_CARE_PROVIDER_SITE_OTHER): Payer: Self-pay | Admitting: Infectious Disease

## 2010-11-15 MED ORDER — MONTELUKAST 10 MG TABLET
10.0000 mg | ORAL_TABLET | Freq: Every day | ORAL | Status: DC
Start: 2010-11-15 — End: 2012-03-14

## 2010-11-15 MED ORDER — ALBUTEROL 90 MCG/ACTUATION AEROSOL INHALER
2.0000 | INHALATION_SPRAY | Freq: Four times a day (QID) | RESPIRATORY_TRACT | Status: DC | PRN
Start: 2010-11-15 — End: 2012-03-14

## 2010-11-15 MED ORDER — EFAVIRENZ 600 MG-EMTRICITABINE 200 MG-TENOFOVIR DISOPROX 300 MG TABLET
1.0000 | ORAL_TABLET | Freq: Every evening | ORAL | Status: DC
Start: 2010-11-15 — End: 2012-03-14

## 2010-11-15 NOTE — Telephone Encounter (Signed)
This medication was just prescribed on 11/10/10 by Dr. Lucia Estelle (Covering for Dr. Sherrie Mustache) for a quantity of 80 tablets with 2 refills.  Left voicemail for patient regarding this.

## 2010-11-15 NOTE — Telephone Encounter (Signed)
Message copied by Providence Lanius on Tue Nov 15, 2010  2:41 PM  ------       Message from: Michel Bickers       Created: Tue Nov 15, 2010 12:06 PM         >> Michel Bickers 11/15/2010 12:06 PM       Dr Sherrie Mustache,                     Ventolin 90 micro inhaler 2 puffs every 6 hrs as needed       Singular 10 mg 1 x day       atripla 1 tab               Call into [address] - [place], [place] - [address]              Phone: [phone] Fax: [phone]

## 2010-11-15 NOTE — Telephone Encounter (Signed)
Message copied by Providence Lanius on Tue Nov 15, 2010  2:38 PM  ------       Message from: Geradine Girt       Created: Tue Nov 15, 2010 12:03 PM         >> AGNES COROB 11/15/2010 12:03 PM       [facility] pt - Rx refill:  TRAMADOL 50 MG TAB pt takes 1 tablet every 6 hrs as needed.              Pharmacy:  [address] - [place], [place] - [address]              Phone: [phone] Fax: [phone]

## 2010-12-27 NOTE — Letter (Signed)
[place] Department of Ophthalmology  [facility]  [address]  [place], [place] [address]      PATIENT NAME: Mike [name], Mike [name]  CHART NUMBER: [id number]  DATE OF BIRTH: Apr 26, 1961  DATE OF SERVICE: 11/18/2007    November 18, 2007     TO: [name] MD    Dear Dr. Sherrie Mustache:    I saw Mr. Canelo today.  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  The remainder of his examination is normal.    I reassured him and suggested he return in 2 years or sooner should he notice any problems.  Please let me know if you have any questions or comments.    Sincerely,      Nadyne Coombes, MD  Associate Professor; Section of [facility] Department of Ophthalmology    [id number]; D: 11/18/2007 11:08:05; T: 11/20/2007 01:00:11    cc: [name] MD      Shirleen Schirmer

## 2011-02-06 ENCOUNTER — Other Ambulatory Visit (INDEPENDENT_AMBULATORY_CARE_PROVIDER_SITE_OTHER): Payer: Self-pay | Admitting: Infectious Disease

## 2011-02-06 MED ORDER — TRAMADOL 50 MG TABLET
50.0000 mg | ORAL_TABLET | Freq: Four times a day (QID) | ORAL | Status: DC | PRN
Start: 2011-02-06 — End: 2012-03-14

## 2011-04-19 ENCOUNTER — Ambulatory Visit (INDEPENDENT_AMBULATORY_CARE_PROVIDER_SITE_OTHER): Payer: Medicare Other | Admitting: Infectious Disease

## 2011-05-01 ENCOUNTER — Encounter (INDEPENDENT_AMBULATORY_CARE_PROVIDER_SITE_OTHER): Payer: Self-pay | Admitting: Family

## 2011-05-02 ENCOUNTER — Other Ambulatory Visit (INDEPENDENT_AMBULATORY_CARE_PROVIDER_SITE_OTHER): Payer: Self-pay | Admitting: Infectious Disease

## 2011-05-02 MED ORDER — FLUTICASONE 250 MCG-SALMETEROL 50 MCG/DOSE BLISTR POWDR FOR INHALATION
1.0000 | DISK | Freq: Two times a day (BID) | RESPIRATORY_TRACT | Status: DC
Start: 2011-05-02 — End: 2012-03-14

## 2011-05-02 NOTE — Telephone Encounter (Signed)
Please approve and sign medication to be eRx'd into patient's preferred pharmacy.

## 2011-06-06 ENCOUNTER — Emergency Department (EMERGENCY_DEPARTMENT_HOSPITAL): Admission: EM | Admit: 2011-06-06 | Discharge: 2011-06-06 | Payer: Medicare Other

## 2011-06-06 ENCOUNTER — Emergency Department (EMERGENCY_DEPARTMENT_HOSPITAL): Payer: Medicare Other

## 2011-07-12 ENCOUNTER — Ambulatory Visit (INDEPENDENT_AMBULATORY_CARE_PROVIDER_SITE_OTHER): Payer: Medicare Other | Admitting: Infectious Disease

## 2011-07-26 ENCOUNTER — Ambulatory Visit (HOSPITAL_BASED_OUTPATIENT_CLINIC_OR_DEPARTMENT_OTHER): Payer: Medicare Other | Admitting: Infectious Disease

## 2011-07-26 ENCOUNTER — Encounter (INDEPENDENT_AMBULATORY_CARE_PROVIDER_SITE_OTHER): Payer: Self-pay

## 2011-07-26 ENCOUNTER — Ambulatory Visit (INDEPENDENT_AMBULATORY_CARE_PROVIDER_SITE_OTHER): Payer: Medicare Other | Admitting: Rheumatology

## 2011-07-26 ENCOUNTER — Ambulatory Visit
Admission: RE | Admit: 2011-07-26 | Discharge: 2011-07-26 | Disposition: A | Discharge status: Home / Self Care | Payer: Medicare Other | Source: Ambulatory Visit | Attending: Infectious Disease | Admitting: Infectious Disease

## 2011-07-26 DIAGNOSIS — Z79899 Other long term (current) drug therapy: Secondary | ICD-10-CM | POA: Insufficient documentation

## 2011-07-26 DIAGNOSIS — F17200 Nicotine dependence, unspecified, uncomplicated: Secondary | ICD-10-CM | POA: Insufficient documentation

## 2011-07-26 DIAGNOSIS — G609 Hereditary and idiopathic neuropathy, unspecified: Secondary | ICD-10-CM | POA: Insufficient documentation

## 2011-07-26 DIAGNOSIS — J45909 Unspecified asthma, uncomplicated: Secondary | ICD-10-CM | POA: Insufficient documentation

## 2011-07-26 DIAGNOSIS — M542 Cervicalgia: Secondary | ICD-10-CM | POA: Insufficient documentation

## 2011-07-26 DIAGNOSIS — Z113 Encounter for screening for infections with a predominantly sexual mode of transmission: Secondary | ICD-10-CM | POA: Insufficient documentation

## 2011-07-26 LAB — CREATININE WITH EGFR
CREATININE: 0.73 mg/dL (ref 0.62–1.27)
ESTIMATED GLOMERULAR FILTRATION RATE: >59 ml/min/1.73m2 (ref >59)

## 2011-07-26 LAB — LIPID PANEL
CHOLESTEROL: 160 mg/dL (ref <200)
HDL-CHOLESTEROL: 35 mg/dL — ABNORMAL LOW (ref >39)
LDL (CALCULATED): 96 mg/dL (ref <100)
NON - HDL (CALCULATED): 125 mg/dL (ref <190)
TRIGLYCERIDES: 146 mg/dL (ref <150)
VLDL (CALCULATED): 29 mg/dL (ref <30)

## 2011-07-26 LAB — GLUCOSE, NON FASTING: GLUCOSE,NONFAST: 102 mg/dL (ref 65–139)

## 2011-07-26 LAB — AST (SGOT): AST (SGOT): 17 U/L (ref 8–48)

## 2011-07-26 LAB — CBC/DIFF
BASOPHILS: 1 % (ref 0–1)
BASOS ABS: 0.058 10*3/uL (ref 0.0–0.2)
EOS ABS: 0.212 THOU/uL (ref 0.1–0.3)
EOSINOPHIL: 3 % (ref 1–6)
HCT: 43.1 % (ref 39.8–50.2)
HGB: 15.4 g/dL (ref 13.1–17.3)
LYMPHOCYTES: 29 % (ref 20–45)
LYMPHS ABS: 2.08 THOU/uL (ref 1.0–4.8)
MCH: 30.8 pg (ref 27.4–33.0)
MCHC: 35.8 g/dL — ABNORMAL HIGH (ref 31.6–35.5)
MCV: 86.2 fL (ref 82.0–99.0)
MONOCYTES: 8 % (ref 4–13)
MONOS ABS: 0.591 THOU/uL (ref 0.1–0.9)
MPV: 8.3 FL (ref 7.4–10.4)
NRBC'S: 0 /100{WBCs}
PLATELET COUNT: 153 THOU/uL (ref 140–450)
PMN ABS: 4.24 THOU/uL (ref 1.5–7.7)
PMN'S: 59 % (ref 40–75)
RBC: 5 MIL/uL (ref 4.46–5.70)
RDW: 13 % (ref 10.2–14.0)
WBC: 7.2 THOU/uL (ref 3.5–11.0)

## 2011-07-26 LAB — CD4/CD8
ABSOLUTE CD4 COUNT: 621 {cells}/uL (ref 382–1614)
ABSOLUTE T LYMPH COUNT: 1668 {cells}/uL (ref 892–2436)
CD3: 80 %
CD4: 30 %
CD4:CD8 RATIO: 0.6 — ABNORMAL LOW (ref 1.0–3.4)
CD8: 50 %

## 2011-07-26 LAB — BUN
BUN/CREAT RATIO: 14 (ref 6–22)
BUN: 10 mg/dL (ref 8–26)

## 2011-07-26 LAB — ALT (SGPT): ALT (SGPT): 12 U/L (ref 7–55)

## 2011-07-26 LAB — BILIRUBIN TOTAL: BILIRUBIN, TOTAL: 0.5 mg/dL (ref 0.3–1.3)

## 2011-07-26 MED ORDER — MONTELUKAST 10 MG TABLET
10.00 mg | ORAL_TABLET | Freq: Every evening | ORAL | Status: DC
Start: 2011-07-26 — End: 2012-03-14

## 2011-07-26 MED ORDER — TRAMADOL 50 MG TABLET
50.00 mg | ORAL_TABLET | Freq: Four times a day (QID) | ORAL | Status: DC | PRN
Start: 2011-07-26 — End: 2012-03-14

## 2011-07-26 MED ORDER — EFAVIRENZ 600 MG-EMTRICITABINE 200 MG-TENOFOVIR DISOPROX 300 MG TABLET
1.00 | ORAL_TABLET | Freq: Every evening | ORAL | Status: DC
Start: 2011-07-26 — End: 2012-03-14

## 2011-07-26 MED ORDER — FLUTICASONE 250 MCG-SALMETEROL 50 MCG/DOSE BLISTR POWDR FOR INHALATION
1.00 | DISK | Freq: Two times a day (BID) | RESPIRATORY_TRACT | Status: DC
Start: 2011-07-26 — End: 2012-03-14

## 2011-07-26 MED ORDER — ALBUTEROL SULFATE HFA 90 MCG/ACTUATION AEROSOL INHALER
1.00 | INHALATION_SPRAY | Freq: Four times a day (QID) | RESPIRATORY_TRACT | Status: DC | PRN
Start: 2011-07-26 — End: 2012-03-14

## 2011-07-26 NOTE — Progress Notes (Signed)
 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  Has not been here for many months due to personal issues, insurance, etc.  Not taking his Atripla since Aug 2012.  No diarrhea, fevers.  Neck pain under control with the Tramadol .  Does not complain of other pains.  Depression unchanged, and having no suicidal thoughts.  Declines seeing a psychiatrist. Sexually active with his HIV positive partner and and not using condoms.   Appetite better.  Got his flu shot 3 weeks ago at [facility]. Smoking between 2 and 4 pks a day.  Not wanting to quit.  Other ROS negative    Current Outpatient Prescriptions   Medication Sig   . traMADol  (ULTRAM ) 50 mg Oral Tablet take 1 Tab by mouth Every 6 hours as needed.   . albuterol  sulfate (PROVENTIL  OR VENTOLIN ) 90 mcg/actuation Inhalation HFA Aerosol Inhaler take 1-2 Puffs by inhalation Every 6 hours as needed. Make this inhaler Proair  for now, due to insurance coverage   . efavirenz -emtricitabine -tenofovir  (ATRIPLA) 600-200-300 mg Oral Tablet take 1 Tab by mouth every night.   . fluticasone -salmeterol (ADVAIR ) 250-50 mcg/dose Inhalation Disk with Device oral diskus inhaler take 1 INHALATION by inhalation Twice daily.   . montelukast  (SINGULAIR ) 10 mg Oral Tablet take 1 Tab by mouth QPM.   . tramadol  (ULTRAM ) 50 mg Oral Tablet take 1 Tab by mouth Every 6 hours as needed.   . fluticasone -salmeterol (ADVAIR ) 250-50 mcg/dose Inhalation Disk with Device oral diskus inhaler take 1 INHALATION by inhalation Twice daily. Inhale 1 puff twice daily. Rinse mouth after each use.   . efavirenz -emtricitabine -tenofovir  (ATRIPLA) 600-200-300 mg Oral Tablet take 1 Tab by mouth every night.   . albuterol  sulfate (PROVENTIL /VENTOLIN ) 90 mcg/actuation Inhl oral inhaler take 2 Puffs by inhalation Every 6 hours as needed.   .  montelukast  (SINGULAIR ) 10 mg Oral Tablet take 1 Tab by mouth Once a day.     O:  BP 118/69  Pulse 86  Temp(Src) 36.5 C (97.7 F) (Oral)  Wt 68.312 kg (150 lb 9.6 oz)  SpO2 97%  Appears well and mood good today.  No rashes  No scleral icterus.  EOMI, PERL  No lymphadenopathy palpated in cervical areas  Oro- no mucosal lesions. Teeth in fair repair  Chest - clear to P and A  Heart - RRR, normal S1 and S2, no murmurs  Abd - normal BS, non tender and no organomegaly  Extrem- no edema  Gait normal    Hospital Outpatient Visit on 10/19/2010   Component Date Value Range Status   . WBC 10/19/2010 5.8  3.5 - 11.0 THOU/uL Final   . RBC 10/19/2010 5.78* 4.46 - 5.70 MIL/uL Final   . HGB 10/19/2010 17.4* 13.1 - 17.3 g/dL Final   . HCT 96/71/7987 51.6* 39.8 - 50.2 % Final   . MCV 10/19/2010 89.2  82.0 - 99.0 fL Final   . MCH 10/19/2010 30.1  27.4 - 33.0 pg Final   . MCHC 10/19/2010 33.8  31.6 - 35.5 g/dL Final   . RDW 96/71/7987 13.2  10.2 - 14.0 % Final   . PLATELET COUNT 10/19/2010 135* 140 - 450 THOU/uL Final   . MPV 10/19/2010 9.0  7.4 - 10.4 FL Final   . PMN'S 10/19/2010 58  40 -  75 % Final   . PMN ABS 10/19/2010 3.350  1.5 - 7.7 THOU/uL Final   . LYMPHOCYTES 10/19/2010 30  20 - 45 % Final   . LYMPHS ABS 10/19/2010 1.750  1.0 - 4.8 THOU/uL Final   . MONOCYTES 10/19/2010 7  4 - 13 % Final   . MONOS ABS 10/19/2010 0.407  0.1 - 0.9 THOU/uL Final   . EOSINOPHIL 10/19/2010 4  1 - 6 % Final   . EOS ABS 10/19/2010 0.212  0.1 - 0.3 THOU/uL Final   . BASOPHILS 10/19/2010 1  0 - 1 % Final   . BASOS ABS 10/19/2010 0.064  0.0 - 0.2 THOU/uL Final   . NRBC'S 10/19/2010 0  0 /100WBC Final   . ALKALINE PHOSPHATASE 10/19/2010 65  38 - 126 U/L Final   . BUN 10/19/2010 10  8 - 26 mg/dL Final   . BUN/CREAT RATIO 10/19/2010 13  6 - 22 Final   . CREATININE 10/19/2010 0.78  0.62 - 1.27 mg/dL Final   . ESTIMATED GLOMERULAR FILTRATION RA* 10/19/2010 >59  >59 ml/min/1.2m2 Final    Comment: IF PATIENT IS [employer] MULTIPLY RESULT BY  1.210                           (NOTE)                           Chronic Kidney Disease Stages based on GFR                           Stage 1   Normal GFR                           Stage 2   60 to 89                           Stage 3   30 to 59                           Stage 4   15 to 29                           Stage 5   <15   . AST (SGOT) 10/19/2010 21  8 - 48 U/L Final   . ALT (SGPT) 10/19/2010 16  7 - 55 U/L Final   . BILIRUBIN, TOTAL 10/19/2010 0.6  0.3 - 1.3 mg/dL Final   . CD3 10/19/2010 80   Final   . CD4 10/19/2010 33   Final   . CD8 10/19/2010 46   Final   . ABSOLUTE T LYMPH COUNT 10/19/2010 1431  892 - 2436 cells/ul Final   . ABSOLUTE CD4 COUNT 10/19/2010 597  382 - 1614 cells/ul Final   . ABSOLUTE CD8 COUNT 10/19/2010 827* 157 - 813 cells/ul Final   . CD4:CD8 RATIO 10/19/2010 0.7* 1.0 - 3.4 Final   . HIV ULTRA VIRAL LOAD (PCR) 10/19/2010 <20   Final    Comment: Performed by PCR using the COBAS AmpliPrep/COBAS TaqMan HIV-1 test,                           version 2.0. This test  is used to monitor known HIV positive patients                           and is not intended for primary detection of HIV infections.                           This assay has a plasma HIV-1 RNA quantification result range from 20                           to 10,000,000 copies/mL (1.30 to 7.00 log10 copies/mL)                           Test developed and performance validated at [place] using an FDA cleared                           assay.   . TS 70 LOG 10 10/19/2010 <1.30   Final   . SERUM SYPHILIS TEST 10/19/2010 NONREACTIVE  NONREACTIVE Final   . MYCOBACTERIUM TUBERCULOSIS BY QUAN* 10/19/2010    Final                    Value:TNP                          NOTIFIED DR. Adley Mazurowski @0927  ON 10/25/10 ARF                          (NOTE)                          M. tuberculosis by QuantiFERON, B was cancelled on                           10/24/2010 at 10:31; One or more of collection tubes was                           under-filled  (<0.8 mL sample). !CNCL!                          This is a qualitative test. The TB antigen IU/mL value is                           required for documentation on certain government reporting                           forms (e.g., Form I-693), but this value should not be used                           to monitor disease progression or response to therapy.                                                    Diagnosing or excluding tuberculosis disease, and assessing  the probability of LTBI, require a combination of                           epidemiological, historical, medical, and diagnostic                           findings that should be taken into account when                           interpreting QuantiFERON-TB results.                                                    Test Performed by:                          [facility]                          [address] [place], [place], [place] [address]                          Laboratory Director: Johnie SAUNDERS. Evelena MOULD, M.D.   . TUBERCULOSIS ANTIGEN VALUE 10/19/2010 NOT REPORTED   Final   . SPECIMEN SITE 10/19/2010 URINE   Final   . CHLAMYDIA TRACHOMATIS 10/19/2010    Final                    Value:Negative. Chlamydia trachomatis DNA NOT DETECTED by Amplicor PCR. A                          negative result does not exclude C. trachomatis infection.                          Test developed and performance validated at [place] using an FDA cleared                          assay.   SABRA SPECIMEN SITE 10/19/2010 URINE   Final   . NEISSERIA GONORRHOEAE 10/19/2010    Final                    Value:Negative. Neisseria gonorrhoeae DNA NOT DETECTED by Amplicor PCR. A                          negative result does not exclude N. gonorrhoeae infection.                          Test developed and performance validated at [place] using an FDA cleared                          assay.     CD4 587, HIV viral load <20, undetectable    A:  HIV  infection - had been stable and doing well for many yrs but more recently off his meds, non adherent  Mental health issues - stable  Asthma - stable  Still smoking and not ready to consider quitting  Chronic neck pain - stable  Peripheral neuropathy - clinically stable regarding pain.  Hx of cocaine abuse - in remission  Needs colonoscopy due to age >45    P:  Restart Atripla one tab by mouth a day.  Emphasized to pt importance of getting back on his meds, and taking every dose.  RTC 2 months  Advised pt again to get a screening colonoscopy.  He will think about it but declines at present.  Continue Singulair , albuterol  (Proair  due to insurance coverage) and Advair  for asthma  Check labs today - including CD4 count, HIV viral load  Advised cut down smoking  Advised him to encourage his partner to come to clinic and be checked.    Andrea Perry, MD 07/26/2011, 11:29 AM

## 2011-07-27 LAB — SYPHILIS AB SCREEN, WITH REFLEX: RPR: NONREACTIVE

## 2011-07-28 LAB — HIV-1 RNA QUANTITATIVE PCR, PLASMA
HIV ULTRA VIRAL LOAD (PCR): 312 {copies}/mL
TS 70 LOG 10: 2.49 {Log}

## 2011-07-28 LAB — NEISSERIA GONORRHOEAE DNA BY PCR: NEISSERIA GONORRHOEAE: NEGATIVE

## 2011-07-28 LAB — CHLAMYDIA TRACHOMITIS DNA BY PCR (INHOUSE): CHLAMYDIA TRACHOMATIS: NEGATIVE

## 2011-07-29 LAB — MYCOBACTERIUM TUBERCULOSIS INFECTION DETERMINATION BY QUANTIFERON-TB G
MYCOBACTERIUM TUBERCULOSIS BY QUANTIFERON IN-TUBE, B: NEGATIVE
TUBERCULOSIS ANTIGEN VALUE: 0

## 2011-10-05 ENCOUNTER — Telehealth (INDEPENDENT_AMBULATORY_CARE_PROVIDER_SITE_OTHER): Payer: Self-pay | Admitting: Infectious Disease

## 2011-10-05 NOTE — Telephone Encounter (Signed)
I called pt and advised he be seen by an MD as soon as possible.  [name], MD 10/05/2011, 9:13 PM

## 2011-10-17 ENCOUNTER — Encounter (INDEPENDENT_AMBULATORY_CARE_PROVIDER_SITE_OTHER): Payer: Medicare Other | Admitting: Infectious Disease

## 2011-11-03 ENCOUNTER — Encounter (INDEPENDENT_AMBULATORY_CARE_PROVIDER_SITE_OTHER): Payer: Self-pay

## 2012-03-14 ENCOUNTER — Other Ambulatory Visit (INDEPENDENT_AMBULATORY_CARE_PROVIDER_SITE_OTHER): Payer: Self-pay | Admitting: Infectious Disease

## 2012-03-14 MED ORDER — EFAVIRENZ 600 MG-EMTRICITABINE 200 MG-TENOFOVIR DISOPROX 300 MG TABLET
1.0000 | ORAL_TABLET | Freq: Every evening | ORAL | Status: DC
Start: 2012-03-14 — End: 2012-06-28

## 2012-03-14 MED ORDER — TRAMADOL 50 MG TABLET
50.0000 mg | ORAL_TABLET | Freq: Four times a day (QID) | ORAL | Status: DC | PRN
Start: 2012-03-14 — End: 2012-11-05

## 2012-03-14 MED ORDER — MONTELUKAST 10 MG TABLET
10.0000 mg | ORAL_TABLET | Freq: Every evening | ORAL | Status: DC
Start: 2012-03-14 — End: 2012-11-05

## 2012-03-14 MED ORDER — ALBUTEROL SULFATE HFA 90 MCG/ACTUATION AEROSOL INHALER
1.0000 | INHALATION_SPRAY | Freq: Four times a day (QID) | RESPIRATORY_TRACT | Status: DC | PRN
Start: 2012-03-14 — End: 2012-07-11

## 2012-03-14 MED ORDER — FLUTICASONE 250 MCG-SALMETEROL 50 MCG/DOSE BLISTR POWDR FOR INHALATION
1.0000 | DISK | Freq: Two times a day (BID) | RESPIRATORY_TRACT | Status: DC
Start: 2012-03-14 — End: 2012-11-05

## 2012-03-14 NOTE — Telephone Encounter (Signed)
Please approve and sign script(s) to be e-rx'd to preferred pharmacy.  Thank you.

## 2012-03-15 ENCOUNTER — Other Ambulatory Visit (INDEPENDENT_AMBULATORY_CARE_PROVIDER_SITE_OTHER): Payer: Self-pay | Admitting: Infectious Disease

## 2012-03-15 NOTE — Telephone Encounter (Signed)
Message copied by Lance Muss on Fri Mar 15, 2012 11:24 AM  ------       Message from: Venia Carbon       Created: Fri Mar 15, 2012  8:44 AM       Regarding: RE: Rx cannot be sent by e-script.         >> Venia Carbon 03/15/2012 08:44 AM                     >> Venia Carbon 03/14/2012 11:22 AM       Dr Armida Sans  Preferred Pharmacy          [facility] - [place], [place] - [address]         [address] [place] [address]         Phone: [phone] Fax: [phone]               Pharmacy stated this is a controlled substance and cannot be sent by e-script must be sent by fax.        traMADol (ULTRAM) 50 mg Oral Tablet 80 Tab 6 03/14/2012              Sig - Route:  Take 1 Tab (50 mg total) by mouth Every 6 hours as needed - Oral

## 2012-03-15 NOTE — Telephone Encounter (Signed)
Called pharmacy and gave verbal prescription to pharmacist.  Lance Muss 03/15/2012, 11:26 AM

## 2012-03-19 ENCOUNTER — Encounter (INDEPENDENT_AMBULATORY_CARE_PROVIDER_SITE_OTHER): Payer: Medicare Other | Admitting: Infectious Disease

## 2012-03-27 ENCOUNTER — Encounter (INDEPENDENT_AMBULATORY_CARE_PROVIDER_SITE_OTHER): Payer: Medicare Other | Admitting: Infectious Disease

## 2012-05-01 ENCOUNTER — Ambulatory Visit: Payer: Medicare Other | Attending: Infectious Disease | Admitting: Infectious Disease

## 2012-05-01 VITALS — BP 141/91 | HR 94 | Temp 97.4°F | Ht 74.0 in | Wt 146.2 lb

## 2012-05-01 DIAGNOSIS — Z21 Asymptomatic human immunodeficiency virus [HIV] infection status: Secondary | ICD-10-CM | POA: Insufficient documentation

## 2012-05-01 DIAGNOSIS — F411 Generalized anxiety disorder: Secondary | ICD-10-CM | POA: Insufficient documentation

## 2012-05-01 DIAGNOSIS — G4700 Insomnia, unspecified: Secondary | ICD-10-CM | POA: Insufficient documentation

## 2012-05-01 DIAGNOSIS — M47812 Spondylosis without myelopathy or radiculopathy, cervical region: Secondary | ICD-10-CM | POA: Insufficient documentation

## 2012-05-01 DIAGNOSIS — M5030 Other cervical disc degeneration, unspecified cervical region: Secondary | ICD-10-CM | POA: Insufficient documentation

## 2012-05-01 NOTE — Progress Notes (Signed)
[facility]                              DEPARTMENT OF MEDICINE                                [place], [place] [address]                                PATIENT NAME: Mike [name], Mike [name]  HOSPITAL UXLKGM:[id number]  DATE OF SERVICE:05/01/2012  DATE OF BIRTH: 11-29-1960    PROGRESS NOTE    PROBLEM LIST:  1.  HIV infection.  2.  Asthma.  3.  Nicotine dependence.  4.  Depression and anxiety.  5.  Insomnia.  6.  History of cocaine use, in remission.  7.  Peripheral neuropathy.  8.  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    SUBJECTIVE:  Mike [name] is not feeling well at all.  He has not been here for almost 9 or 10 months.  He is very frustrated because of his neck pain.  He continues to use marijuana and he states that he cannot get into [name] pain clinic because of this.  He is frustrated that he may not be able to get narcotics for his neck pain.  He also is having very significant anxiety and is not sleeping well at all.  When we first started speaking, he said he did not want to see any psychiatrist here and was even thinking of moving to [name] different state to get care.  We did not discuss sexual activity today.  He is having no suicidal ideation.  He is not eating well.    Later in the interview when I came back into his room, he apologized for being so angry.  He just reiterated that he was very frustrated with his neck pain and his anxiety.  He requests to be on Xanax.  He continues to smoke [name] great deal and we did not discuss quitting today.    He denies diarrhea, abdominal pain or fevers.  Otherwise, review of systems is negative.    He denies using any drugs except for the marijuana.    MEDICATIONS:    Current Outpatient Prescriptions   Medication Status Sig   . efavirenz-emtricitabine-tenofovir (ATRIPLA) 600-200-300 mg Oral Tablet Active Take 1 Tab by mouth Every night    . fluticasone-salmeterol (ADVAIR) 250-50 mcg/dose Inhalation Disk with Device oral diskus inhaler Active Take 1 INHALATION by inhalation Twice daily   . montelukast (SINGULAIR) 10 mg Oral Tablet Active Take 1 Tab (10 mg total) by mouth Every evening   . traMADol (ULTRAM) 50 mg Oral Tablet Active Take 1 Tab (50 mg total) by mouth Every 6 hours as needed   . albuterol sulfate (PROVENTIL OR VENTOLIN) 90 mcg/actuation Inhalation HFA Aerosol Inhaler Active Take 1-2 Puffs by inhalation Every 6 hours as needed Make this inhaler Proair for now, due to insurance coverage     No current facility-administered medications for this visit.         OBJECTIVE:  Vital signs:    BP 141/91   Pulse 94   Temp(Src) 36.3 C (97.4 F) (Tympanic)   Ht 1.88 m (6\' 2" )   Wt 66.3 kg (146 lb 2.6 oz)   BMI 18.76 kg/m2   SpO2  97%      Overall, he appears very frustrated, thinner than usual but stable.  No rashes.  No scleral icterus.  Extraocular muscle strength intact.  Pupils equally reactive to light.  Oropharynx is clear.  No mucosal lesions seen.  Teeth are in fair repair.  Chest is clear to auscultation and percussion.  Heart is regular rate and rhythm, normal S1, S2.  No murmurs or gallops are present.  Abdomen is thin.  Normal bowel sounds, soft, no hepatosplenomegaly, masses or tenderness.  Extremities show no edema, clubbing or cyanosis.  His gait is normal and he moves all 4 extremities normally and well.  Cranial nerves intact.    LABS:  HIV viral load from February 2013 was 312 and his CD4 count was 621.  QuantiFeron test negative.    ASSESSMENT:  1.  HIV infection diagnosed more in the late 1990s, stable and doing 100% adherent to his medications, taking every dose (he had been off HIV meds by his own choice about [name] year ago; now back on therapy and taking them regularly).  2.  Significant anxiety and probably depression.  3.  Insomnia.   4.  Cervical pain and cervical degenerative spine disease.  No neurological signs on physical exam but very symptomatic.  5.  Other problems as per the chart.    PLANS:  1.  We will draw blood today including the CD4 count, HIV viral load and other routine labs.  2.  He already has received an influenza vaccine [name] few weeks ago.  3.  I referred him to neurosurgery.  He may need to have [name] repeat MRI of his neck since it has been [name] couple of years and, if so, we will order this.  4.  I have asked our care manager, Okey Regal, to speak with him and help, if possible, with transportation issues and other financial help.  5.  I will make an appointment for him for next week with Dr. Derek Mound, our psychiatrist.  6.  I urged him to continue taking his medications and I will refill them.  He takes tramadol for his pain.  We will continue his Atripla for his HIV infection.  7. Will discuss need for colonoscopy (screening) next visit.      [name], MD   Professor; Section of Infectious Disease  [facility]    [name], MD 05/01/2012, 5:32 PM      [id number]; D: 05/01/2012 13:39:27; T: 05/01/2012 14:16:36

## 2012-05-01 NOTE — Progress Notes (Signed)
ID Staff  Note dictated  [name], MD 05/01/2012, 12:32 PM

## 2012-05-02 ENCOUNTER — Other Ambulatory Visit (INDEPENDENT_AMBULATORY_CARE_PROVIDER_SITE_OTHER): Payer: Self-pay | Admitting: Infectious Disease

## 2012-05-08 ENCOUNTER — Ambulatory Visit
Admission: RE | Admit: 2012-05-08 | Discharge: 2012-05-08 | Disposition: A | Discharge status: Home / Self Care | Payer: Medicare Other | Source: Ambulatory Visit | Attending: Infectious Disease | Admitting: Infectious Disease

## 2012-05-08 ENCOUNTER — Encounter (INDEPENDENT_AMBULATORY_CARE_PROVIDER_SITE_OTHER): Payer: Self-pay | Admitting: Psychiatry

## 2012-05-08 ENCOUNTER — Ambulatory Visit (HOSPITAL_BASED_OUTPATIENT_CLINIC_OR_DEPARTMENT_OTHER): Payer: Medicare Other | Admitting: Psychiatry

## 2012-05-08 VITALS — BP 112/75 | HR 75 | Temp 98.0°F | Ht 73.62 in | Wt 149.9 lb

## 2012-05-08 DIAGNOSIS — R634 Abnormal weight loss: Secondary | ICD-10-CM | POA: Insufficient documentation

## 2012-05-08 DIAGNOSIS — J45909 Unspecified asthma, uncomplicated: Secondary | ICD-10-CM | POA: Insufficient documentation

## 2012-05-08 DIAGNOSIS — R109 Unspecified abdominal pain: Secondary | ICD-10-CM | POA: Insufficient documentation

## 2012-05-08 DIAGNOSIS — B20 Human immunodeficiency virus [HIV] disease: Secondary | ICD-10-CM | POA: Insufficient documentation

## 2012-05-08 LAB — CD4/CD8
ABSOLUTE CD4 COUNT: 534 cells/ul (ref 382–1614)
ABSOLUTE CD8 COUNT: 1169 {cells}/uL — ABNORMAL HIGH (ref 157–813)
ABSOLUTE T LYMPH COUNT: 1697 {cells}/uL (ref 892–2436)
CD3: 84 %
CD4: 27 %
CD4:CD8 RATIO: 0.5 — ABNORMAL LOW (ref 1.0–3.4)
CD8: 58 %

## 2012-05-08 LAB — BUN
BUN/CREAT RATIO: 13 (ref 6–22)
BUN: 13 mg/dL (ref 8–26)

## 2012-05-08 LAB — CBC/DIFF
BASOPHILS: 1 %
HGB: 15.7 g/dL (ref 12.5–16.3)
MCH: 30.7 pg (ref 27.4–33.0)
MCHC: 34.7 g/dL (ref 32.5–35.8)
MCV: 88.4 fL (ref 78–100)
MONOS ABS: 0.6 10*3/uL (ref 0.3–1.0)
MPV: 10.1 fL (ref 7.5–11.5)
PLATELET COUNT: 130 THOU/uL — ABNORMAL LOW (ref 140–450)
RBC: 5.12 MIL/uL (ref 4.06–5.63)
RDW: 15.2 % — ABNORMAL HIGH (ref 12.0–15.0)

## 2012-05-08 LAB — ALT (SGPT): ALT (SGPT): 14 U/L (ref 7–55)

## 2012-05-08 LAB — CREATININE WITH EGFR
CREATININE: 1.03 mg/dL (ref 0.62–1.27)
ESTIMATED GLOMERULAR FILTRATION RATE: >59 ml/min/1.73m2 (ref >59)

## 2012-05-08 LAB — BILIRUBIN TOTAL: BILIRUBIN, TOTAL: 0.4 mg/dL (ref 0.3–1.3)

## 2012-05-08 LAB — ALK PHOS (ALKALINE PHOSPHATASE): ALKALINE PHOSPHATASE: 55 U/L (ref 38–126)

## 2012-05-08 LAB — AST (SGOT): AST (SGOT): 20 U/L (ref 8–48)

## 2012-05-09 LAB — HIV-1 RNA QUANTITATIVE PCR, PLASMA
HIV ULTRA VIRAL LOAD (PCR): 4340 {copies}/mL
TS 70 LOG 10: 3.64 {Log}

## 2012-05-10 NOTE — Progress Notes (Signed)
Patient left before being seen.  Encounter opened in error.  Problem list marked as reviewed solely b/c I needed to mark it to close encounter.  I haven not met patient and am not signing off on problem list b/c I agree with problem list.      Derek Mound, MD 05/10/2012, 8:24 AM

## 2012-05-13 ENCOUNTER — Ambulatory Visit
Admission: RE | Admit: 2012-05-13 | Discharge: 2012-05-13 | Disposition: A | Discharge status: Home / Self Care | Payer: Medicare Other | Source: Ambulatory Visit | Attending: Infectious Disease | Admitting: Infectious Disease

## 2012-05-13 ENCOUNTER — Other Ambulatory Visit (INDEPENDENT_AMBULATORY_CARE_PROVIDER_SITE_OTHER): Payer: Self-pay | Admitting: Infectious Disease

## 2012-05-14 ENCOUNTER — Other Ambulatory Visit (INDEPENDENT_AMBULATORY_CARE_PROVIDER_SITE_OTHER): Payer: Self-pay | Admitting: Infectious Disease

## 2012-05-15 ENCOUNTER — Encounter (HOSPITAL_COMMUNITY): Payer: Self-pay

## 2012-05-15 NOTE — Ancillary Notes (Addendum)
[facility] Record for: Mike [name], Mike [name]  Created: 05/15/2012 7:53:40 AM  MRN: [id number]  DOB: 1961/03/16  SSN: [id number]  Sex: Male  Height:  Feet  Inches - Weight:   [facility] Name:   Address:   [address]  [place]: [place], [place] [address]  E-Mail:   Day Phone: [phone] Night Phone:  Other Phone:   Phone comments:   Call Back time:   Authorized contact:   Intake Date: 05/15/2012 7:53:40 AM  PCP:    RefMD: Sherrie Mustache MD, Melanie   Primary Insurance:   Secondary Insurance:   Insurance Comments:      -------Referral Assignment------  Where was the original referral directed?: Spine Center  Was a specific reviewer requested?: Specific MD Requested  How was the reviewer selected?:   Who requested the specific reviewer?: Other Physician  How did you hear of our spine program?:   Is this a second opinion?:      -------Merchandiser, retail----------  Favorite Color:   City of Birth:      ---------- 1st Review ----------     Review Date:   Review Completed by:   Impression:   Disposition:   Appt w/ Colleague:   Colleague Name:   Appt How Soon:   Pre Treatment Type:   Pre Treatment Type Details:   Pre Treatment Other Test:   Appt Type:   Instructions:      ---------- Symptoms ----------     Chief complaint:   Diagnosis from Other MD:      Symptoms:   Other Symptom Description:   Pain Location:   Other Pain Location Description:   Where is your pain the worst?:   Pain Type:   Pain Rating:   Does the pain radiate to other parts of your body?    Radiate Where:   Other Description:   Does it radiate to the fingers?   Does it radiate below the elbow?   Which specific part of your arm?   Which fingers?   Which part of your arm does pain go to?   How does it radiate to the arm?   Does it radiate to the toes?   Does it radiate below the knee?   Which specific part of your leg?   Which toes?   Which part of your leg does pain go to?   How does it radiate to the leg?   Additional pain information       Location of Numbness/tingling:   Other Description:  Does numbness/tingling radiate to other parts of your body?:  Where does the numbess/tingling radiate?:  Other Description:  Does the numbness/tingling radiate below your elbow?:  Which specific part of your arm?:  Which fingers:  Which pat of your arm does the numbness/tingling go to?:  How does the numbness/tingling radiate to your arm?:  Does the numbness/tingling radiate below your knee?:  Which specific part of your leg?:  Which toes:  Which part of your leg does the numbness/tingling go to?:  How does the numbness/tingling radiate to your leg?:  Additional Numbness/tingling information:  Other Description:      Location of Weakness:   Other Description:   Additional Weakness Information:      The symptoms have been present for:   The symptoms began:   Was there a specific event that caused your symptoms?:   Additional Narrative Description:   Are you able to perform your daily activities with these symptoms?:  Since what date have you been unable to perform your daily routine?:   The symptoms improve when you:   Other activities that improve your symptoms:   The symptoms worsen when you:   Other activities that worsen your symptoms:      ---------- Work History ----------     Are you able to work?:   Reasons for not working:   Other Reason for not working:   Do you have Work Restrictions:   If applicable, maximum lifting restriction:   How long have you been unable to work:   Have you ever filed a W/C claim related to a neck or back injury?:   Occupation:   Other Occupation Information:      ---------- Bowel/Bladder/Incontinence Issues ----------     Since the onset of symptoms, have you experienced any new problems urinating or having bowel movements?:   Description:   Other Description:   How long have you had these bowel/bladder problems?:      ---------- Allergies ----------     Do you have any medication allergies?   Allergies:   Other Details:    Allergic to Latex?:   Allergic to intravenous contrast dyes?:   Allergic to steroids?:      ---------- Treatment/Testing ----------      Taking prescription medication for this problem?:   Are you using any now?:   Have you received a Medrol dose pack for this problem?:   When did you last take the dosepack?:   Were your symptoms improved?:   Would you describe your relief as:   How long did you experience that amount of relief?:   Other Medrol dosepack information:      --------------- PT ---------------     PT:   When Received:   Where Received:   Other where received information:   Visits:   Types:   Other Types:   Improved:      If improved, describe level of relief:   If improved, how long did you experience relief:   Other Physical Therapy Treatment Information:      ---------- Chiropractic Services ----------     Chiro:   When:   Who:   Visits:   Types:   Other Types:   Were Symptoms Improved:   If improved, describe level of relief:   If improved, how long did you experience relief:   Other Chiropractic Treatment Information:      --------------- ESI ---------------     ESI:   When:   Who:   Visits:   Types:   Improved:   If improved, describe level of relief:   If improved, how long did you experience relief:   Other Injection Treatment Information:      ---------- Diagnostic Tests ----------     MRI scan On:10/21/2013MR  [id number] Where: [facility] PACS  Area Scanned: Cervical  Do you have any of the following in case an MRI is ordered?:   Other MRI factors:      ---------- Past Medical History ----------     What other doctors/providers have treated you for these spine issues?     Ever diagnosed with Spine deformity?:   Prior neck or back surgery (1)?:   When:   Who:   Area of neck/spine operated on:   Level:   Were symptoms improved?:   If improved, describe level of relief:  If improved, how long did you experience relief?:   Prior neck or back surgery (2)?:  When:   Who:   Area of neck/spine operated on:    Level:   Were symptoms improved?:   If improved, describe level of relief:  If improved, how long did you experience relief?:   Prior neck or back surgery (3)?:   When:   Who:   Area of neck/spine operated on:   Level:   Were symptoms improved?:   If improved, describe level of relief:  If improved, how long did you experience relief?:   Do you have any of the following assistive devices?   How long have you required these assistive devices?   Currently being treated for any other medical condition?:   Conditions:   Other Conditions:   Type of neurologic disorder:   Cancer Type:   Other Treatment/Medication:   What blood thinners are you currently taking?:   Which physicians are treating you for your other medical conditions?:   Do you smoke?:   Are you pre-menopausal or post-menopausal?:   If recommended, are you willing to consider surgery?:   What is your goal in seeking treatment?:   Other pertinent information/ general comments/ goals for treatment:      ---------- Care Coordinator Information ----------     Care Coordinator:   Activity Log:      Letter/Test Coordinator: Letters  Letter/Test Coordinator Log: 05/15/2012 8:56:31 AM Rosey Bath Benson]: Patient already scheduled for an appointment with NS in January. Will complete file. TBenson; 05/17/2012 10:43:07 AM Elnita Maxwell Chidester]: Call-in letter mailed to patient. Lucille Passy     Intake Specialist Comments: 05/15/2012 9:43:58 AM Rosey Bath Benson]: Attempted to contact patient to obtain his/her medical history. Left message and phone  on answering machine for patient to return our call. Mailed call in reminder post card. Cyndia Diver; 05/17/2012 10:27:32 AM Maxine Glenn Livengood]: Attempted to contact patient to obtain his/her medical history.  Left message and phone  on answering machine for patient to return our call. Call-in letter requested//M. Livengood     Clinic Staff Comments:      Last Edited by: Knute Neu on 05/28/2012 10:38:25 AM  Last Review by:  on

## 2012-05-16 ENCOUNTER — Ambulatory Visit (INDEPENDENT_AMBULATORY_CARE_PROVIDER_SITE_OTHER): Payer: Self-pay | Admitting: Infectious Disease

## 2012-05-16 NOTE — Telephone Encounter (Signed)
Called patient to schedule sooner appt due to increased VL & sched Behav Med appt in our clinic.  Patient refused Behavioral Med appt.  States he has quit taking his Atripla because it makes him feel "terrible", "dizzy", "drunk", wants med changed.  Reports intermittent adherence over the last year.   Sched for 11/19.  Patient states he is looking to transfer all of his care outside of [facility] due to traffic & logistics with travel etc.

## 2012-05-27 ENCOUNTER — Encounter (INDEPENDENT_AMBULATORY_CARE_PROVIDER_SITE_OTHER): Payer: Self-pay | Admitting: Infectious Disease

## 2012-05-28 NOTE — Progress Notes (Signed)
[facility] AND [facility]                              DEPARTMENT OF MEDICINE                                [place], [place] [address]                                PATIENT NAME: Mike [name], Mike [name]  HOSPITAL UEAVWU:[id number]  DATE OF SERVICE:05/27/2012  DATE OF BIRTH: 1961/05/09    PROGRESS NOTE    May 27, 2012    [facility]  [address]  [place], [place] [address]    Dear Mr. Sipe:    I hope you have been doing better.  I am sorry but I was unable to contact you by telephone.  I have an idea of changing your medications in order to reduce the side effects that you may be having.  I think this really may work.  I would appreciate if you could contact me soon and we can have you come into clinic sooner.  Alternatively, we could just talk over the phone about it and I could prescribe it for you.  Please contact me at your earliest convenience about this.      I also hope you have been able to get the appointment regarding your neck pain to see if you need surgery.  Please let me know if you have any questions or need to have your appointment moved up.      Take care and please accept my best regards.      Sincerely,      [name], MD   Professor; Section of Infectious Disease  [facility]    [name], MD 05/28/2012, 9:29 PM      [id number]; D: 05/27/2012 13:28:35; T: 05/28/2012 08:14:18

## 2012-06-11 ENCOUNTER — Ambulatory Visit
Admission: RE | Admit: 2012-06-11 | Discharge: 2012-06-11 | Disposition: A | Discharge status: Home / Self Care | Payer: Medicare Other | Source: Ambulatory Visit | Attending: Infectious Disease | Admitting: Infectious Disease

## 2012-06-11 ENCOUNTER — Encounter (INDEPENDENT_AMBULATORY_CARE_PROVIDER_SITE_OTHER): Payer: Self-pay | Admitting: Infectious Disease

## 2012-06-11 ENCOUNTER — Ambulatory Visit (HOSPITAL_BASED_OUTPATIENT_CLINIC_OR_DEPARTMENT_OTHER): Payer: Medicare Other | Admitting: Infectious Disease

## 2012-06-11 VITALS — BP 105/65 | HR 72 | Temp 96.5°F | Ht 73.27 in | Wt 151.9 lb

## 2012-06-11 DIAGNOSIS — F1210 Cannabis abuse, uncomplicated: Secondary | ICD-10-CM | POA: Insufficient documentation

## 2012-06-11 DIAGNOSIS — M5030 Other cervical disc degeneration, unspecified cervical region: Secondary | ICD-10-CM | POA: Insufficient documentation

## 2012-06-11 DIAGNOSIS — Z21 Asymptomatic human immunodeficiency virus [HIV] infection status: Secondary | ICD-10-CM | POA: Insufficient documentation

## 2012-06-11 DIAGNOSIS — F3289 Other specified depressive episodes: Secondary | ICD-10-CM | POA: Insufficient documentation

## 2012-06-11 DIAGNOSIS — F17200 Nicotine dependence, unspecified, uncomplicated: Secondary | ICD-10-CM | POA: Insufficient documentation

## 2012-06-11 DIAGNOSIS — F411 Generalized anxiety disorder: Secondary | ICD-10-CM | POA: Insufficient documentation

## 2012-06-11 LAB — CD4/CD8
ABSOLUTE CD4 COUNT: 165 {cells}/uL — ABNORMAL LOW (ref 382–1614)
ABSOLUTE CD8 COUNT: 375 {cells}/uL (ref 157–813)
ABSOLUTE T LYMPH COUNT: 548 {cells}/uL — ABNORMAL LOW (ref 892–2436)
CD3: 83 %
CD4: 25 %
CD4:CD8 RATIO: 0.4 — ABNORMAL LOW (ref 1.0–3.4)
CD8: 57 %

## 2012-06-11 NOTE — Progress Notes (Signed)
[facility] AND [facility]                              DEPARTMENT OF MEDICINE                                [place], [place] [address]                                PATIENT NAME: Mike [name]  HOSPITAL BJYNWG:[id number]  DATE OF SERVICE:06/11/2012  DATE OF BIRTH: 07-18-1961    PROGRESS NOTE    PROBLEM LIST:    1. HIV infection.  2. Asthma.  3. Nicotine dependence.  4. Depression and anxiety.  5. Insomnia.  6. History of cocaine use, in remission.  7. Peripheral neuropathy.  8. 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    SUBJECTIVE:  [name] is feeling somewhat better overall today.  He continues to have trouble sleeping after he takes his Atripla and then sometimes feels groggy during the day.  He is very interested in switching medication if we can do this.  He continues to use marijuana infrequently and also continues to smoke about 2 packs a cigarettes per day.  He has not been taking his Atripla for 3-4 months.  No diarrhea, fevers or chills.  He has gained a few pounds since last time and he is eating better.  He tends to eat 1 large meal usually between 4-7 o'clock late in the day.    Otherwise, review of systems is negative.    MEDICATIONS:  Notably, he is not taking his Atripla now by his own choice.  Current Outpatient Prescriptions   Medication Sig   . albuterol sulfate (PROVENTIL OR VENTOLIN) 90 mcg/actuation Inhalation HFA Aerosol Inhaler Take 1-2 Puffs by inhalation Every 6 hours as needed Make this inhaler Proair for now, due to insurance coverage   . efavirenz-emtricitabine-tenofovir (ATRIPLA) 600-200-300 mg Oral Tablet Take 1 Tab by mouth Every night   . fluticasone-salmeterol (ADVAIR) 250-50 mcg/dose Inhalation Disk with Device oral diskus inhaler Take 1 INHALATION by inhalation Twice daily   . montelukast (SINGULAIR) 10 mg Oral Tablet Take 1 Tab (10 mg total) by mouth Every evening    . traMADol (ULTRAM) 50 mg Oral Tablet Take 1 Tab (50 mg total) by mouth Every 6 hours as needed     No current facility-administered medications for this visit.         OBJECTIVE:  Vital signs:  BP 105/65   Pulse 72   Temp(Src) 35.8 C (96.5 F) (Tympanic)   Ht 1.861 m (6' 1.27")   Wt 68.9 kg (151 lb 14.4 oz)   BMI 19.89 kg/m2   SpO2 96%    Overall, he appears stable and appears to weigh more than the last time I saw him.  He has no rash, no scleral icterus.  Extraocular muscles are intact.  Oropharynx is clear.  Mucosa is pink and no lesions present.  No lymphadenopathy in his neck.  Chest is clear to auscultation and percussion.  Heart regular rate and rhythm, normal S1, S2, no murmurs or gallops are present.  Pulses are full and equal.  Abdomen is normal bowel sounds, soft, nontender and no hepatosplenomegaly or masses.      LABORATORY DATA:  His HIV viral load  was 4340 and the CD4 count was 534 on May 09, 2012.    ASSESSMENT:    1. Human immunodeficiency virus infection, now not taking his medication, mostly because of possible side effects.  The central nervous system side effects he is describing may due most likely to the efavirenz component of his Atripla.    2. Neck pain, cervical disk disease, progressive.  3. Depression and anxiety, stable.  4. Other problems stable as listed above.     PLAN:  1. We will draw blood today including HIV viral load, CD4 count and HIV genotype test as well as the Integrase genotype.  2. If there is no resistance to components of Complera then we will start this in a couple of weeks or sooner if possible.  I explained this to the patient.  Also, we explained that he must have a high calorie meal, at least 500 calories, and take the medication Complera with it.  He understands this.  I am also asking our pharmacist, Dr. Ollen Bowl, to talk to him about this today.   3. I gave him an appointment for about 6 weeks from now.  By the time, I hope he will be able to be on the Complera and we will review side effects and see how he is doing.  4. I checked his appointment schedule and he has an appointment with Dr. Cherrie Distance in neurosurgery clinic regarding his neck on August 05, 2012.  I strongly encouraged him to keep this appointment.  I did my best to answer his questions.      [name], MD   Professor; Section of Infectious Disease  [facility]    [name], MD 06/12/2012, 3:47 PM      [id number]; D: 06/11/2012 12:06:50; T: 06/11/2012 78:46:96

## 2012-06-11 NOTE — Progress Notes (Signed)
PHARMACY NOTE:    Tyren, Fulmer  MRN:  [id number]  DOB:  1961-05-05  Date of Service:  06/11/2012    S:  Met with patient at request of Dr. Sherrie Mustache for regimen change.  Taken Atripla since 10/2009, but stopped three to four months ago secondary to intolerable side effects of feeling drunk or being up all night.  Tolerated these side effects for many years, but now has home needing repair and feels change is necessary.  Eats dinner between 4-8 PM which is a high calorie meal.   O:  05/08/12  CD4 534 (27%)  Viral Load  4340  A: Currently nonadherent patient to ART.  Ready for regimen change secondary to side effects.  P:  Begin Complera 1 tablet daily.  Educated patient on medication and side effects verbally and in writing.  Reinforced importance of adherence to prevent resistance from developing, increase CD4 count, and decrease viral load.  Patient verbalized understanding.  Provided contact information to patient.  Will continue to follow in clinic.    Twila Rappa L. Tressy Kunzman, R.Ph., Pharm.D., AAHIVP

## 2012-06-11 NOTE — Progress Notes (Signed)
ID staff    Note dictated  [name], MD 06/11/2012, 12:06 PM

## 2012-06-13 ENCOUNTER — Other Ambulatory Visit (INDEPENDENT_AMBULATORY_CARE_PROVIDER_SITE_OTHER): Payer: Self-pay | Admitting: Infectious Disease

## 2012-06-13 MED ORDER — ATOVAQUONE 750 MG/5 ML ORAL SUSPENSION
1500.00 mg | Freq: Every morning | ORAL | Status: DC
Start: 2012-06-13 — End: 2012-11-05

## 2012-06-14 LAB — HIV-1 RNA QUANTITATIVE PCR, PLASMA
HIV ULTRA VIRAL LOAD (PCR): 83100 COPIES/mL
TS 70 LOG 10: 4.92 LOG10

## 2012-06-19 LAB — HIV-1 GENOTYPIC DRUG RESISTANCE TO PROTEASE AND REVERSE TRANSCRIPTASE INHIBITORS, PLASMA

## 2012-06-26 LAB — HIV1 INTEGRASE GENOTYPE

## 2012-06-28 ENCOUNTER — Encounter (INDEPENDENT_AMBULATORY_CARE_PROVIDER_SITE_OTHER): Payer: Self-pay | Admitting: Infectious Disease

## 2012-06-28 ENCOUNTER — Other Ambulatory Visit (INDEPENDENT_AMBULATORY_CARE_PROVIDER_SITE_OTHER): Payer: Self-pay | Admitting: Infectious Disease

## 2012-06-28 MED ORDER — EMTRICITABINE 200 MG-TENOFOVIR DISOPROXIL FUMARATE 300 MG TABLET
1.0000 | ORAL_TABLET | Freq: Every day | ORAL | Status: DC
Start: 2012-06-28 — End: 2012-12-17

## 2012-06-28 MED ORDER — DOLUTEGRAVIR 50 MG TABLET
50.0000 mg | ORAL_TABLET | Freq: Every day | ORAL | Status: DC
Start: 2012-06-28 — End: 2012-12-17

## 2012-07-01 ENCOUNTER — Other Ambulatory Visit (INDEPENDENT_AMBULATORY_CARE_PROVIDER_SITE_OTHER): Payer: Self-pay | Admitting: Infectious Disease

## 2012-07-01 NOTE — Progress Notes (Signed)
[facility] AND [facility]                              DEPARTMENT OF MEDICINE                                [place], [place] [address]                                PATIENT NAME: Mike [name], Mike [name]  HOSPITAL UEAVWU:[id number]  DATE OF SERVICE:06/28/2012  DATE OF BIRTH: 1961-02-01    PROGRESS NOTE    ATTENDING NOTE    We are going to change Mr. Ebrahim's regimen of his antiretroviral therapy.  Based on the recent genotype resistance testing, his virus is resistant to efavirenz and nevirapine.  No resistance is predicted for any of the other drugs including the integrase inhibitors tested.     We will stop his Atripla.  I will start Truvada 1 tablet per day and also dolutegravir 50 mg once a day.  Both of these will be once a day pills.  I called the patient and explained this to him and he is very agreeable to this plan.  He has an appointment for early January and I urged him to keep this appointment.  At that time, we will see how he is tolerating the new medication regimen and also will check labs on him that day.  He should continue to take his atovaquone for pneumocystis prophylaxis as well.      [name], MD   Professor; Section of Infectious Disease  [facility]    [name], MD 07/01/2012, 12:56 PM      [id number]; D: 06/28/2012 16:51:01; T: 06/30/2012 08:03:54

## 2012-07-31 ENCOUNTER — Encounter (INDEPENDENT_AMBULATORY_CARE_PROVIDER_SITE_OTHER): Payer: Self-pay | Admitting: Infectious Disease

## 2012-08-05 ENCOUNTER — Ambulatory Visit (INDEPENDENT_AMBULATORY_CARE_PROVIDER_SITE_OTHER): Payer: Medicare Other | Admitting: Neurological Surgery

## 2012-09-25 ENCOUNTER — Ambulatory Visit (INDEPENDENT_AMBULATORY_CARE_PROVIDER_SITE_OTHER): Payer: Self-pay | Admitting: Neurological Surgery

## 2012-10-23 ENCOUNTER — Encounter (INDEPENDENT_AMBULATORY_CARE_PROVIDER_SITE_OTHER): Payer: Medicare Other | Admitting: Infectious Disease

## 2012-11-05 ENCOUNTER — Ambulatory Visit (INDEPENDENT_AMBULATORY_CARE_PROVIDER_SITE_OTHER): Payer: Medicare Other | Admitting: Rheumatology

## 2012-11-05 ENCOUNTER — Encounter (INDEPENDENT_AMBULATORY_CARE_PROVIDER_SITE_OTHER): Payer: Self-pay | Admitting: Infectious Disease

## 2012-11-05 ENCOUNTER — Ambulatory Visit (HOSPITAL_BASED_OUTPATIENT_CLINIC_OR_DEPARTMENT_OTHER): Payer: Medicare Other | Admitting: Infectious Disease

## 2012-11-05 ENCOUNTER — Ambulatory Visit
Admission: RE | Admit: 2012-11-05 | Discharge: 2012-11-05 | Disposition: A | Discharge status: Home / Self Care | Payer: Medicare Other | Source: Ambulatory Visit | Attending: Infectious Disease | Admitting: Infectious Disease

## 2012-11-05 VITALS — BP 114/72 | HR 70 | Temp 97.3°F | Ht 73.47 in | Wt 152.8 lb

## 2012-11-05 DIAGNOSIS — J45909 Unspecified asthma, uncomplicated: Secondary | ICD-10-CM | POA: Insufficient documentation

## 2012-11-05 DIAGNOSIS — B20 Human immunodeficiency virus [HIV] disease: Secondary | ICD-10-CM | POA: Insufficient documentation

## 2012-11-05 DIAGNOSIS — F341 Dysthymic disorder: Secondary | ICD-10-CM | POA: Insufficient documentation

## 2012-11-05 DIAGNOSIS — F17200 Nicotine dependence, unspecified, uncomplicated: Secondary | ICD-10-CM | POA: Insufficient documentation

## 2012-11-05 DIAGNOSIS — Z202 Contact with and (suspected) exposure to infections with a predominantly sexual mode of transmission: Secondary | ICD-10-CM | POA: Insufficient documentation

## 2012-11-05 LAB — CBC/DIFF
BASOPHILS: 1 %
BASOS ABS: 0.059 THOU/uL (ref 0.0–0.2)
EOS ABS: 0.27 10*3/uL (ref 0.0–0.5)
EOSINOPHIL: 5 %
HCT: 48.1 % — ABNORMAL HIGH (ref 36.7–47.0)
HGB: 16.3 g/dL (ref 12.5–16.3)
LYMPHOCYTES: 38 %
LYMPHS ABS: 2.227 THOU/uL (ref 1.0–4.8)
MCH: 29 pg (ref 27.4–33.0)
MCHC: 33.9 g/dL (ref 32.5–35.8)
MCV: 85.5 fL (ref 78–100)
MONOCYTES: 9 %
MONOS ABS: 0.492 THOU/uL (ref 0.3–1.0)
MPV: 9.7 fL (ref 7.5–11.5)
PLATELET COUNT: 122 THOU/uL — ABNORMAL LOW (ref 140–450)
PMN ABS: 2.755 10*3/uL (ref 1.5–7.7)
PMN'S: 47 %
RBC: 5.63 MIL/uL (ref 4.06–5.63)
RDW: 14.5 % (ref 12.0–15.0)
WBC: 5.8 10*3/uL (ref 3.5–11.0)

## 2012-11-05 LAB — AST (SGOT): AST (SGOT): 19 U/L (ref 8–45)

## 2012-11-05 LAB — CD4/CD8
ABSOLUTE CD4 COUNT: 605 cells/ul (ref 382–1614)
ABSOLUTE CD8 COUNT: 1231 {cells}/uL — ABNORMAL HIGH (ref 157–813)
ABSOLUTE T LYMPH COUNT: 1852 cells/ul (ref 892–2436)
CD3: 84 %
CD4: 27 %
CD4:CD8 RATIO: 0.5 — ABNORMAL LOW (ref 1.0–3.4)
CD8: 56 %

## 2012-11-05 LAB — CREATININE: CREATININE: 0.96 mg/dL (ref 0.62–1.27)

## 2012-11-05 LAB — BILIRUBIN TOTAL: BILIRUBIN, TOTAL: 0.5 mg/dL (ref 0.3–1.3)

## 2012-11-05 LAB — ALT (SGPT): ALT (SGPT): 13 U/L (ref <55)

## 2012-11-05 LAB — BUN
BUN/CREAT RATIO: 13 (ref 6–22)
BUN: 12 mg/dL (ref 8–25)

## 2012-11-05 LAB — CREATININE WITH EGFR: ESTIMATED GLOMERULAR FILTRATION RATE: >59 ml/min/1.73m2 (ref >59)

## 2012-11-05 LAB — ALK PHOS (ALKALINE PHOSPHATASE): ALKALINE PHOSPHATASE: 52 U/L (ref <150)

## 2012-11-05 MED ORDER — ATOVAQUONE 750 MG/5 ML ORAL SUSPENSION
1500.0000 mg | Freq: Every morning | ORAL | Status: DC
Start: 2012-11-05 — End: 2013-02-05

## 2012-11-05 MED ORDER — FLUTICASONE 250 MCG-SALMETEROL 50 MCG/DOSE BLISTR POWDR FOR INHALATION
1.0000 | DISK | Freq: Two times a day (BID) | RESPIRATORY_TRACT | Status: DC
Start: 2012-11-05 — End: 2013-05-23

## 2012-11-05 MED ORDER — NAPROXEN 500 MG TABLET
500.00 mg | ORAL_TABLET | Freq: Two times a day (BID) | ORAL | Status: DC
Start: 2012-11-05 — End: 2013-05-06

## 2012-11-05 NOTE — Progress Notes (Signed)
Met with pt for lipid education session.  Reviewed lipid results in [place] with pt and discussed their significance.  Questions enc and answered.  Pt still denies any desire to quit smoking at present.  Enc pt to contact [facility] with any needs or concerns.

## 2012-11-05 NOTE — Progress Notes (Signed)
PHARMACY NOTE:   Lazarus, Sudbury   MRN: [id number]   DOB: [birth year]   Date of Service: 11/05/2012   S: Met with patient for follow-up regimen change.  Doing well except for tooth extraction.  Regimen changed to Complera at last visit, but genotyping showed resistance to rilpivirine.  Never took medication.  Currently on Truvada 1 tablet and dolutegravir 1 tablet daily with dinner between 4-8 PM since 12/13.  Also takes atovaquone suspension 1500 mg daily.  On average takes between 4-6 PM.  Does not take antacids.  No side effects reported and has not missed a dose since starting regimen.  Rates himself as 6/10 on adherence scale due to not taking medication on time.   O: 06/11/12 CD4 165 (25%) Viral Load 83,100   A: Currently adherent to cART.  Feel adherence is better than 6/10 and when question changed it to 8/10.  P: Continue Truvada/doutegravir/atovaquone . Reinforced importance of always having dose with him (states he never leaves house), adherence to prevent resistance from developing, increase CD4 count, and decrease viral load.  Patient verbalized understanding.  Provided contact information to patient.  Will continue to follow in clinic.   Towanda Hornstein L. Lovetta Condie, R.Ph., Pharm.D., AAHIVP

## 2012-11-06 ENCOUNTER — Ambulatory Visit (INDEPENDENT_AMBULATORY_CARE_PROVIDER_SITE_OTHER): Payer: Self-pay | Admitting: Neurological Surgery

## 2012-11-06 LAB — NEISSERIA GONORRHOEAE DNA BY PCR: NEISSERIA GONORRHOEAE: NEGATIVE

## 2012-11-06 LAB — CHLAMYDIA TRACHOMITIS DNA BY PCR (INHOUSE): CHLAMYDIA TRACHOMATIS: NEGATIVE

## 2012-11-06 LAB — SERUM SYPHILIS TEST: RPR: NONREACTIVE

## 2012-11-06 NOTE — Progress Notes (Signed)
[facility] AND [facility]                              DEPARTMENT OF MEDICINE                                [place], [place] [address]                                PATIENT NAME: Mike [name], Mike [name]  HOSPITAL UEAVWU:[id number]  DATE OF SERVICE:11/05/2012  DATE OF BIRTH: Apr 07, 1961    PROGRESS NOTE    PROBLEM LIST:  1.  Human immunodeficiency virus (HIV) infection, advanced.  2.  Asthma.  3.  Nicotine dependence.  4.  Depression and anxiety.  5.  History of cocaine use, in remission.  6.  Peripheral neuropathy.  7.  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    SUBJECTIVE:  Mr. Lo is taking his new medication for his HIV.  This consists of Truvada and dolutegravir, the new integrase inhibitor.  Truvada is 1 tablet a day and dolutegravir is 50 mg once a day.  He is tolerating this well and feels that some of the difficulties he was having before may have been related to the efavirenz component of the Atripla.    He stated that he stopped taking atovaquone because he ran out.  Also his insurance will no longer cover Singulair or tramadol.  He had several teeth removed yesterday and he is still having some pain in the lower jaw.  No fever, chills or night sweats.  His mental state regarding his depression is about the same and no worse.    MEDICATIONS:    Current Outpatient Prescriptions   Medication Sig   . atovaquone (MEPRON) 750 mg/5 mL Oral Suspension Take 10 mL (1,500 mg total) by mouth Every morning with breakfast   . cyanocobalamin (VITAMIN B 12) 1,000 mcg Oral Tablet Take by mouth Once a day UNKNOWN   . dolutegravir 50 mg Oral Tablet Take 50 mg by mouth Once a day   . emtricitabine-tenofovir (TRUVADA) 200-300 mg Oral Tablet Take 1 Tab by mouth Once a day   . fluticasone-salmeterol (ADVAIR) 250-50 mcg/dose Inhalation Disk with Device oral diskus inhaler Take 1 INHALATION by inhalation Twice daily   . multivitamin Oral Tablet Take 1 Tab by mouth Once a day    . naproxen (NAPROSYN) 500 mg Oral Tablet Take 1 Tab (500 mg total) by mouth Twice daily with food   . PROAIR HFA 90 mcg/actuation Inhalation HFA Aerosol Inhaler INHALE 2 PUFFS BY INHALATION EVERY 6 HOURS AS NEEDED. MAKE THIS INHALER PROAIR FOR NOW, DUE TO INSURANCE COVERAGE           OBJECTIVE:  Vital signs:   BP 114/72   Pulse 70   Temp(Src) 36.3 C (97.3 F) (Tympanic)   Ht 1.866 m (6' 1.47")   Wt 69.3 kg (152 lb 12.5 oz)   BMI 19.9 kg/m2   SpO2 97%    He appears stable.  He has no rash.  He is missing several teeth on the bottom jaw in the midline right now.  The mucosa appears to be healing.  Extraocular muscles are intact.  No rash or jaundice.  Chest is clear to auscultation and percussion.  Heart has a regular rate and rhythm,  normal S1 and S2.  No murmurs or gallops present.  Abdomen has normal bowel sounds.  Soft and nontender.  No hepatosplenomegaly or masses or tenderness.  Extremities have no edema.    LABORATORY DATA:  His HIV viral load was over 80,000 last time on June 11, 2012, and the CD4 count was below 200.    Other labs were unremarkable.    ASSESSMENT:  1.  Possible exposure to syphilis in his sexual partner.  2.  Not using condoms.  3.  Human immunodeficiency virus (HIV) infection, advanced.  Tolerating new regimen.  4.  Depression and anxiety.  Stable.  5.  Nicotine dependence.  6.  Asthma.  Stable.  7.  Status post recent dental extraction.    PLAN:    1.  I counseled Mr. Schlabach first on using condoms.  I told him it is very important to do this.  He says he does not use them.  He states also that his partner has HIV infection.    2.  I will check HIV viral load and CD4 count today.  3.  He will continue the same HIV regimen which is Truvada plus dolutegravir.  We will check viral load and CD4 count and also RPR.  4.  I will have him return in about 1-2 months.  5.  I prescribed Naproxen 500 mg b.i.d. for his pain.  I told him to always take this with food.   6.  We will look into whether we can get a drug assistance program for Singulair for him because it helps his allergies and asthma a great deal.  7.  I offered a visit with the behavioral medicine specialist, but he would like to think about this.  He states he is stable.      [name], MD  Professor; Section of Infectious Disease  [facility]    [name], MD 11/06/2012, 2:04 PM      [id number]; D: 11/05/2012 13:27:21; T: 11/06/2012 06:40:27

## 2012-11-07 LAB — MYCOBACTERIUM TUBERCULOSIS INFECTION DETERMINATION BY QUANTIFERON-TB G
MYCOBACTERIUM TUBERCULOSIS BY QUANTIFERON IN-TUBE, B: NEGATIVE
TUBERCULOSIS ANTIGEN VALUE: 0

## 2012-11-07 LAB — HIV-1 RNA QUANTITATIVE PCR, PLASMA
HIV ULTRA VIRAL LOAD (PCR): <20 {copies}/mL — AB
TS 70 LOG 10: <1.3 {Log} — ABNORMAL HIGH

## 2012-12-17 ENCOUNTER — Other Ambulatory Visit (INDEPENDENT_AMBULATORY_CARE_PROVIDER_SITE_OTHER): Payer: Self-pay | Admitting: Infectious Disease

## 2012-12-17 NOTE — Telephone Encounter (Signed)
Request for refills.  Please review & approve e-RX to preferred pharmacy

## 2013-01-01 ENCOUNTER — Encounter (INDEPENDENT_AMBULATORY_CARE_PROVIDER_SITE_OTHER): Payer: Medicare Other | Admitting: Infectious Disease

## 2013-02-05 ENCOUNTER — Encounter (INDEPENDENT_AMBULATORY_CARE_PROVIDER_SITE_OTHER): Payer: Self-pay | Admitting: Infectious Disease

## 2013-02-05 ENCOUNTER — Ambulatory Visit: Payer: Medicare Other | Attending: Infectious Disease | Admitting: Infectious Disease

## 2013-02-05 VITALS — BP 116/73 | HR 71 | Temp 97.3°F | Resp 20 | Ht 73.58 in | Wt 155.9 lb

## 2013-02-05 DIAGNOSIS — F3289 Other specified depressive episodes: Secondary | ICD-10-CM | POA: Insufficient documentation

## 2013-02-05 DIAGNOSIS — M5090 Cervical disc disorder, unspecified, unspecified cervical region: Secondary | ICD-10-CM | POA: Insufficient documentation

## 2013-02-05 DIAGNOSIS — B20 Human immunodeficiency virus [HIV] disease: Secondary | ICD-10-CM | POA: Insufficient documentation

## 2013-02-05 DIAGNOSIS — J45909 Unspecified asthma, uncomplicated: Secondary | ICD-10-CM | POA: Insufficient documentation

## 2013-02-05 DIAGNOSIS — Z23 Encounter for immunization: Secondary | ICD-10-CM | POA: Insufficient documentation

## 2013-02-05 DIAGNOSIS — F17200 Nicotine dependence, unspecified, uncomplicated: Secondary | ICD-10-CM | POA: Insufficient documentation

## 2013-02-05 DIAGNOSIS — G609 Hereditary and idiopathic neuropathy, unspecified: Secondary | ICD-10-CM | POA: Insufficient documentation

## 2013-02-05 DIAGNOSIS — F411 Generalized anxiety disorder: Secondary | ICD-10-CM | POA: Insufficient documentation

## 2013-02-05 DIAGNOSIS — F1421 Cocaine dependence, in remission: Secondary | ICD-10-CM | POA: Insufficient documentation

## 2013-02-05 NOTE — H&P (Signed)
ID Staff  Note dictated  [name], MD 02/05/2013, 12:03 PM

## 2013-02-05 NOTE — Progress Notes (Signed)
PHARMACY NOTE:   Mike [name], Mike [name]   MRN: [id number]   DOB: 08/31/1960   Date of Service: 02/05/2013  S: Met with patient for visit following regimen change.  Doing very well.  Currently on Truvada 1 tablet and dolutegravir 1 tablet (50 mg) daily between 9 AM and 12 Noon.  No side effects reported nor doses missed since last visit.  Rates himself as 9/10 on adherence scale due to not taking medication on time.  Sometimes gets busy in morning and doesn't take medication until later in the evening.  Complained about having to wait to see provider and won't wait at lab if it is busy either.   O:  11/05/2012  CD4 605 (27%)   Viral Load  <20   A:  Adherent to ART.  Looks extremely well.  Quit smoking, but using e-cigarette.    P:  Continue Truvada/doutegravir.  Congratulated patient on excellent adherence to date.  Reinforced importance of always having dose with him, adherence to prevent resistance from developing, increase CD4 count, and decrease viral load.  Patient verbalized understanding.  Explained option of changing providers if didn't like the wait.  Reinforced importance of going to lab and suggested possibility of getting labs before his appointments.  Provided contact information to patient. Will continue to follow in clinic.   Lometa Riggin L. Renee Erb, R.Ph., Pharm.D., AAHIVP

## 2013-02-05 NOTE — Patient Instructions (Signed)
Pneumococcal Conjugate Vaccine  What You Need to Know  Your doctor recommends that you, or your child, get a dose of PCV13 vaccine today.  WHY GET VACCINATED?  Pneumococcal conjugate vaccine (called PCV13 or Prevnar 13) is recommended to protect infants and toddlers, and some older children and adults with certain health conditions, from pneumococcal disease.  Pneumococcal disease is caused by infection with Streptococcus pneumoniae bacteria. These bacteria can spread from person to person through close contact.  Pneumococcal disease can lead to severe health problems, including pneumonia, blood infections, and meningitis.  Meningitis is an infection of the covering of the brain. Pneumococcal meningitis is fairly rare (less than 1 case per 100,000 people each year), but it leads to other health problems, including deafness and brain damage. In children, it is fatal in about 1 case out of 10.  Children younger than two are at higher risk for serious disease than older children.  People with certain medical conditions, people over age 65, and cigarette smokers are also at higher risk.  Before vaccine, pneumococcal infections caused many problems each year in the [place] in children younger than 5, including:   more than 700 cases of meningitis,   13,000 blood infections,   about 5 million ear infections, and   about 200 deaths.  About 4,000 adults still die each year because of pneumococcal infections.  Pneumococcal infections can be hard to treat because some strains are resistant to antibiotics. This makes prevention through vaccination even more important.  PCV13 VACCINE  There are more than 90 types of pneumococcal bacteria. PCV13 protects against 13 of them. These 13 strains cause most severe infections in children and about half of infections in adults.    PCV13 is routinely given to children at 2, 4, 6, and 12 15 months of age. Children in this age range are at greatest risk for serious diseases caused by pneumococcal infection.  PCV13 vaccine may also be recommended for some older children or adults. Your doctor can give you details.  A second type of pneumococcal vaccine, called PPSV23, may also be given to some children and adults, including anyone over age 65. There is a separate Vaccine Information Statement for this vaccine.  PRECAUTIONS   Anyone who has ever had a life-threatening allergic reaction to a dose of this vaccine, to an earlier pneumococcal vaccine called PCV7 (or Prevnar), or to any vaccine containing diphtheria toxoid (for example, DTaP), should not get PCV13.  Anyone with a severe allergy to any component of PCV13 should not get the vaccine. Tell your doctor if the person being vaccinated has any severe allergies.  If the person scheduled for vaccination is sick, your doctor might decide to reschedule the shot on another day.  Your doctor can give you more information about any of these precautions.  RISKS   With any medicine, including vaccines, there is a chance of side effects. These are usually mild and go away on their own, but serious reactions are also possible.  Reported problems associated with PCV13 vary by dose and age, but generally:   About half of children became drowsy after the shot, had a temporary loss of appetite, or had redness or tenderness where the shot was given.   About 1 out of 3 had swelling where the shot was given.   About 1 out of 3 had a mild fever, and about 1 in 20 had a higher fever (over 102.2 F or 39 C).   Up to about   8 out of 10 became fussy or irritable.  Adults receiving the vaccine have reported redness, pain, and swelling where the shot was given. Mild fever, fatigue, headache, chills, or muscle pain have also been reported.  Life-threatening allergic reactions from any vaccine are very rare.   WHAT IF THERE IS A SERIOUS REACTION?  What should I look for?  Look for anything that concerns you, such as signs of a severe allergic reaction, very high fever, or behavior changes.  Signs of a severe allergic reaction can include hives, swelling of the face and throat, difficulty breathing, a fast heartbeat, dizziness, and weakness. These would start a few minutes to a few hours after the vaccination.  What should I do?   If you think it is a severe allergic reaction or other emergency that can't wait, get the person to the nearest hospital or call 9-1-1. Otherwise, call your doctor.   Afterward, the reaction should be reported to the "Vaccine Adverse Event Reporting System" (VAERS). Your doctor might file this report, or you can do it yourself through the VAERS web site at www.vaers.hhs.gov, or by calling 1-800-822-7967.  VAERS is only for reporting reactions. They do not give medical advice.  THE NATIONAL VACCINE INJURY COMPENSATION PROGRAM  The National Vaccine Injury Compensation Program (VICP) was created in 1986.  Persons who believe they may have been injured by a vaccine can learn about the program and about filing a claim by calling 1-800-338-2382 or visiting the VICP website at www.hrsa.gov/vaccinecompensation.  HOW CAN I LEARN MORE?   Ask your doctor.   Call your local or state health department.   Contact the Centers for Disease Control and Prevention (CDC):   Call 1-800-232-4636 (1-800-CDC-INFO) or   Visit CDC's website at www.cdc.gov/vaccines  CDC PCV13 Vaccine VIS (Interim) (09/20/11)  Document Released: 05/07/2006 Document Revised: 01/09/2012 Document Reviewed: 10/27/2008  ExitCare Patient Information 2014 [employer].

## 2013-02-05 NOTE — Progress Notes (Signed)
Met with pt for f/u smoking cessation session.  Pt states he has been cigarette free for a little over one month and is slowly tapering off an e-cig vaporizer.  He believes he is ready to go without the vaporizer and is going to try giving it up completely.  I commended him on his efforts to quit and enc him to keep up the good work.  Questions enc and answered.  Will f/u as needed at future visits

## 2013-02-05 NOTE — Progress Notes (Signed)
 ID Staff  Note dictated  Andrea Perry, MD 02/05/2013, 12:13 PM

## 2013-02-06 NOTE — Progress Notes (Signed)
 [facility] AND [facility]                              DEPARTMENT OF MEDICINE                                [place], [place] [address]                                PATIENT NAME: Mike [name], Mike [name]  HOSPITAL WLFAZM:[id number]  DATE OF SERVICE:02/05/2013  DATE OF BIRTH: 1960-09-28    PROGRESS NOTE    INFECTIOUS DISEASE ATTENDING NOTE    PROBLEM LIST:  1.  HIV infection, advanced.  2.  Asthma.  3.  Nicotine dependence.  4.  Depression and anxiety.  5.  History of cocaine use, in remission.  6.  Peripheral neuropathy.  7.  Cervical disk disease and chronic pain.    SUBJECTIVE:  Mike [name] states he is feeling very well and tolerating the new medication well.  His regimen now consists of Truvada  1 tablet a day and dolutegravir  50 mg once a day.  He has no side effects at all from the dolutegravir , and he is 100% compliant to the regimen without missing any doses.    He gives us  very good news today in that he has stopped smoking for several months.  He is using the electronic cigarette.  However, he feels he is almost ready to give up the electronic cigarette as well.  He denies any drug use, except daily marijuana, which is about the same amount he has used for a long time.  He is sexually active with a partner who is also HIV positive.  They do not use condoms.  He has no other sexual contacts.    No fever, chills or night sweats.  He denies any symptoms of depression right now.  Otherwise, the review of systems is negative.    MEDICATIONS:   Current Outpatient Prescriptions   Medication Sig   . cyanocobalamin  (VITAMIN B 12) 1,000 mcg Oral Tablet Take by mouth Once a day UNKNOWN   . fluticasone -salmeterol (ADVAIR ) 250-50 mcg/dose Inhalation Disk with Device oral diskus inhaler Take 1 INHALATION by inhalation Twice daily   . multivitamin Oral Tablet Take 1 Tab by mouth Once a day   . naproxen  (NAPROSYN ) 500 mg Oral Tablet Take 1 Tab (500 mg total) by mouth Twice daily with food   . PROAIR   HFA 90 mcg/actuation Inhalation HFA Aerosol Inhaler INHALE 2 PUFFS BY INHALATION EVERY 6 HOURS AS NEEDED. MAKE THIS INHALER PROAIR  FOR NOW, DUE TO INSURANCE COVERAGE   . TIVICAY  50 mg Oral Tablet TAKE 1 TABLET BY MOUTH ONCE DAILY   . TRUVADA  200-300 mg Oral Tablet TAKE 1 TABLET BY MOUTH ONCE DAILY         OBJECTIVE:  Vital signs:   BP 116/73  Pulse 71  Temp(Src) 36.3 C (97.3 F) (Oral)  Resp 20  Ht 1.869 m (6' 1.58)  Wt 70.7 kg (155 lb 13.8 oz)  BMI 20.24 kg/m2  SpO2 96%      Overall, he appears well and stable, and he is in good spirits today.  No rash, no jaundice, no scleral icterus.  Oropharynx:  Teeth are in only fair repair.  No lymphadenopathy is palpable in his neck or  elsewhere.  Chest:  Exam reveals prolonged expiratory phase but no true wheezes.  No crackles heard.  He is breathing comfortably.  Heart:  Regular rate and rhythm, normal S1, S2.  No murmurs or gallops are present.  Pulse is full and equal.  Abdomen:  Normal bowel sounds, soft, nontender, no hepatosplenomegaly, tenderness or masses.  Extremities:  No edema, clubbing or cyanosis.    LABORATORY DATA:  The CD4 count was 605 from November 05, 2012, and his HIV viral load was less than 20, undetectable.    ASSESSMENT:  1.  Human immunodeficiency virus infection.  Though advanced, now he is doing very well on the current regimen, tolerating the integrase inhibitor, dolutegravir  very well.  2.  Asthma.  Under good control.  3.  Nicotine dependence but now in remission for several months.  4.  Depression and anxiety, stable.  5.  Other problems as stated above and stable.  6. Chronic pain. Seems stable    PLAN:  1.  I congratulated him on smoking cessation.  I encouraged him to continue to completely avoid smoking.  He is using the electronic cigarettes; however, if this is helping him stop regular cigarettes, I am still in favor of this plan.  He has not been able to stop smoking for many years.  He also feels he may be ready to give up on the  electronic cigarettes soon.  I encouraged him to do so.  I also encouraged him to try to cut down on the amount of marijuana he uses.  2.  We will continue the Truvada  and dolutegravir  for his HIV infection.  3.  We will draw labs today, CD4 count, HIV viral load, lipid panel (it should be noted that he is not fasting, but it is difficult for him to come to other appointments).  We will continue his inhalers for his asthma.  However, he is unable to have the Singulair  paid for by insurance, so he has stopped this and does not really notice a difference.  4.  He does not need to take atovaquone  at this time because his CD4 count is very good.  5.  Regarding the naproxen , he asked if he could take a higher dose.  I will check into the total dose for him, and we will get back to him about this.  I also consulted him to make sure he takes the naproxen  with food and that we will need to check his kidney function periodically because of the amount he is taking.  6.  I will have him return to clinic in about 3 months.  I also told him to call me sooner if needed.  7.  He wants to defer an eye exam for the present time, although I advised this.  8.  He will receive the Prevnar vaccine today.      Andrea Perry, MD  Professor; Section of Infectious Disease  [facility]    Andrea Perry, MD 02/06/2013, 11:14 PM      FQ/umf/[id number]; D: 02/05/2013 16:46:27; T: 02/06/2013 17:29:55

## 2013-02-07 ENCOUNTER — Ambulatory Visit (INDEPENDENT_AMBULATORY_CARE_PROVIDER_SITE_OTHER): Payer: Self-pay | Admitting: Infectious Disease

## 2013-02-07 NOTE — Telephone Encounter (Signed)
Patient was seen in clinic on 7/16.  Discussed with Dr. Sherrie Mustache increased pain (neck pain due to disc issues).  I received message from Dr. Sherrie Mustache to call patient to inform him he is on maximum dose of Naproxyn, which patient is taking.  Dr. Sherrie Mustache thought maybe she could add an additional medicine to help relieve pain (flexeril was mentioned).  I have confirmed patient is taking current dose of Naproxyn listed on med list.  Please advise on what medication needs to be added.

## 2013-02-11 ENCOUNTER — Ambulatory Visit (INDEPENDENT_AMBULATORY_CARE_PROVIDER_SITE_OTHER): Payer: Self-pay | Admitting: Infectious Disease

## 2013-02-11 NOTE — Telephone Encounter (Addendum)
Rseponse from Dr. Sherrie Mustache copied below 02/11/13 at 3:00pm:  "Toula Moos, We will need to call the pt. I will try to do this tomorrow from clinic. We need to explain that he is already on maximum advised dose of Naproxyn, and we need to see if he would like to switch to a different NSAID or add maybe Flexeril, etc.   Melanie"    Dr. Sherrie Mustache,  Please advise. Has this been addressed? Triage from 02/07/13 closed but no action documented. Billie Ruddy, RN 02/11/2013, 1:45 PM    Patient was seen in clinic on 7/16. Discussed with Dr. Sherrie Mustache increased pain (neck pain due to disc issues). I received message from Dr. Sherrie Mustache to call patient to inform him he is on maximum dose of Naproxyn, which patient is taking. Dr. Sherrie Mustache thought maybe she could add an additional medicine to help relieve pain (flexeril was mentioned). I have confirmed patient is taking current dose of Naproxyn listed on med list. Please advise on what medication needs to be added.

## 2013-02-13 ENCOUNTER — Ambulatory Visit (INDEPENDENT_AMBULATORY_CARE_PROVIDER_SITE_OTHER): Payer: Self-pay | Admitting: Infectious Disease

## 2013-02-13 ENCOUNTER — Encounter (INDEPENDENT_AMBULATORY_CARE_PROVIDER_SITE_OTHER): Payer: Self-pay | Admitting: Infectious Disease

## 2013-02-13 NOTE — Telephone Encounter (Signed)
 Patient called this morning as a f/u. States he has not yet received a phone call regarding changing his NSAID to another medication (see triage from 02/07/13). Explained to patient I had spoken with Dr. Gasper yesterday and she is to call him to discuss options. Patient is open to trying Flexeril. Please advise on plan. Margean Leatherwood, RN 02/13/2013, 3:12 PM

## 2013-02-14 NOTE — Progress Notes (Signed)
 [facility] AND [facility]                              DEPARTMENT OF MEDICINE                                [place], [place] [address]                                PATIENT NAME: Mike [name], Mike [name]  HOSPITAL WLFAZM:[id number]  DATE OF SERVICE:02/13/2013  DATE OF BIRTH: March 05, 1961    PROGRESS NOTE    TELEPHONE NOTE    I spoke to Mr. Tibbett on the phone.  He has agreed to try Flexeril to help his neck pain.  I advised him to go ahead and try it and to continue the Naprosyn  as well.  He will keep us  posted and let us  know if this is helping.  I called in a prescription for Flexeril 10 mg by mouth 3 times a day as needed for pain, 90 tablets with 2 refills.      Andrea Perry, MD  Professor; Section of Infectious Disease  [facility]    Andrea Perry, MD 02/14/2013, 5:44 PM      FQ/umf/[id number]; D: 02/13/2013 16:57:48; T: 02/14/2013 13:02:54

## 2013-02-19 ENCOUNTER — Other Ambulatory Visit (INDEPENDENT_AMBULATORY_CARE_PROVIDER_SITE_OTHER): Payer: Self-pay | Admitting: Infectious Disease

## 2013-02-19 NOTE — Telephone Encounter (Signed)
Flexeril phoned in by Dr. Sherrie Mustache but medication not entered into [facility]. Added medication to snapshot. Billie Ruddy, RN 02/19/2013, 11:15 AM

## 2013-03-20 ENCOUNTER — Other Ambulatory Visit (INDEPENDENT_AMBULATORY_CARE_PROVIDER_SITE_OTHER): Payer: Self-pay | Admitting: Infectious Disease

## 2013-03-20 MED ORDER — ALBUTEROL SULFATE HFA 90 MCG/ACTUATION AEROSOL INHALER
INHALATION_SPRAY | RESPIRATORY_TRACT | Status: DC
Start: 2013-03-20 — End: 2015-09-06

## 2013-03-20 NOTE — Telephone Encounter (Signed)
Fax request for refill received from the preferred pharmacy.  Due to meaningful use, please approve and sign requests to be e-rx'd to preferred pharmacy.

## 2013-04-25 ENCOUNTER — Ambulatory Visit (INDEPENDENT_AMBULATORY_CARE_PROVIDER_SITE_OTHER): Payer: Self-pay | Admitting: Infectious Disease

## 2013-04-25 NOTE — Telephone Encounter (Signed)
 Patient called reporting he had an unprotected sexual contact last weekend and is now reporting Pubic Lice.  Patient has seen the Lice and has been trying to treat with OTC remedies. Is requesting Kwell Lotion.  Please Please advise regarding treatment for Pubic Lice.    Patient counseled on safe sexual practices and was asked to come in to have STI screening done.  Patient states he has an appointment in 2 weeks and will get screening at this time.  Please advise regarding treatment for Pubic Lice.

## 2013-04-25 NOTE — Telephone Encounter (Signed)
ID Staff  I called Mike [name] and recommended that he buy Nix at the drugstore (permethrin topical lotion 1%) and apply it now, and then again in 7 days.  Advised him to wash all his clothes and bedsheets and towels in hot water.  Also to avoid sexual activity until this is cured and after he has been screened for other STD's.  Advised that his sex partners be treated also.  I told him I would be glad to see him this Wednesday instead of waiting two weeks. He is willing to do this so we will contact him to confirm.  [name], MD 04/25/2013, 6:00 PM

## 2013-04-30 ENCOUNTER — Encounter (INDEPENDENT_AMBULATORY_CARE_PROVIDER_SITE_OTHER): Payer: Medicare Other | Admitting: Infectious Disease

## 2013-05-06 ENCOUNTER — Encounter (INDEPENDENT_AMBULATORY_CARE_PROVIDER_SITE_OTHER): Payer: Self-pay | Admitting: Infectious Disease

## 2013-05-06 ENCOUNTER — Other Ambulatory Visit (INDEPENDENT_AMBULATORY_CARE_PROVIDER_SITE_OTHER): Payer: Self-pay | Admitting: Infectious Disease

## 2013-05-06 NOTE — Telephone Encounter (Signed)
Please approve and sign script(s) to be e-rx'd to preferred pharmacy.  Thank you.

## 2013-05-16 ENCOUNTER — Other Ambulatory Visit (INDEPENDENT_AMBULATORY_CARE_PROVIDER_SITE_OTHER): Payer: Self-pay | Admitting: Infectious Disease

## 2013-05-23 ENCOUNTER — Other Ambulatory Visit (INDEPENDENT_AMBULATORY_CARE_PROVIDER_SITE_OTHER): Payer: Self-pay | Admitting: Infectious Disease

## 2013-06-24 ENCOUNTER — Other Ambulatory Visit (INDEPENDENT_AMBULATORY_CARE_PROVIDER_SITE_OTHER): Payer: Self-pay | Admitting: Infectious Disease

## 2013-08-13 ENCOUNTER — Encounter (INDEPENDENT_AMBULATORY_CARE_PROVIDER_SITE_OTHER): Payer: Medicare Other | Admitting: Infectious Disease

## 2013-08-15 ENCOUNTER — Other Ambulatory Visit (INDEPENDENT_AMBULATORY_CARE_PROVIDER_SITE_OTHER): Payer: Self-pay | Admitting: Infectious Disease

## 2013-09-01 ENCOUNTER — Encounter (FREE_STANDING_LABORATORY_FACILITY)
Admit: 2013-09-01 | Discharge: 2013-09-01 | Disposition: A | Discharge status: Home / Self Care | Payer: Self-pay | Attending: Internal Medicine | Admitting: Internal Medicine

## 2013-09-03 LAB — CD4/CD8
ABSOLUTE CD4 COUNT: 443 cells/ul (ref 382–1614)
ABSOLUTE CD8 COUNT: 819 {cells}/uL — ABNORMAL HIGH (ref 157–813)
ABSOLUTE T LYMPH COUNT: 1259 cells/ul (ref 892–2436)
CD3: 79 %
CD3: 79 %
CD4: 28 %
CD4:CD8 RATIO: 0.5 — ABNORMAL LOW (ref 1.0–3.4)
CD8: 52 %

## 2013-12-29 ENCOUNTER — Encounter (FREE_STANDING_LABORATORY_FACILITY): Admit: 2013-12-29 | Discharge: 2013-12-29 | Disposition: A | Discharge status: Home / Self Care | Payer: Self-pay

## 2013-12-31 LAB — CD4/CD8
ABSOLUTE CD4 COUNT: 451 {cells}/uL (ref 382–1614)
ABSOLUTE CD8 COUNT: 748 cells/ul (ref 157–813)
ABSOLUTE T LYMPH COUNT: 1206 {cells}/uL (ref 892–2436)
CD3: 77 %
CD4: 29 %
CD4:CD8 RATIO: 0.6 — ABNORMAL LOW (ref 1.0–3.4)
CD8: 48 %
CD8: 48 %

## 2014-01-13 ENCOUNTER — Other Ambulatory Visit (INDEPENDENT_AMBULATORY_CARE_PROVIDER_SITE_OTHER): Payer: Self-pay | Admitting: Infectious Disease

## 2014-01-13 DIAGNOSIS — R52 Pain, unspecified: Secondary | ICD-10-CM

## 2014-01-13 MED ORDER — NAPROXEN 500 MG TABLET
500.0000 mg | ORAL_TABLET | Freq: Two times a day (BID) | ORAL | Status: DC
Start: 2014-01-13 — End: 2015-09-06

## 2014-01-13 NOTE — Telephone Encounter (Signed)
Fax request for refill received from the preferred pharmacy.  Due to meaningful use, please approve and sign requests to be e-rx'd to preferred pharmacy.

## 2014-05-11 ENCOUNTER — Ambulatory Visit (INDEPENDENT_AMBULATORY_CARE_PROVIDER_SITE_OTHER): Payer: Medicare Other | Admitting: Family Medicine

## 2014-05-11 ENCOUNTER — Encounter (INDEPENDENT_AMBULATORY_CARE_PROVIDER_SITE_OTHER): Payer: Self-pay | Admitting: Family Medicine

## 2014-05-11 VITALS — BP 120/80 | HR 92 | Temp 97.1°F | Resp 16 | Ht 74.0 in | Wt 139.7 lb

## 2014-05-11 DIAGNOSIS — Z21 Asymptomatic human immunodeficiency virus [HIV] infection status: Secondary | ICD-10-CM

## 2014-05-11 DIAGNOSIS — J4530 Mild persistent asthma, uncomplicated: Secondary | ICD-10-CM

## 2014-05-11 DIAGNOSIS — M542 Cervicalgia: Secondary | ICD-10-CM

## 2014-05-11 DIAGNOSIS — G8929 Other chronic pain: Secondary | ICD-10-CM

## 2014-05-11 DIAGNOSIS — Z681 Body mass index (BMI) 19 or less, adult: Secondary | ICD-10-CM

## 2014-05-11 DIAGNOSIS — R634 Abnormal weight loss: Secondary | ICD-10-CM

## 2014-05-11 DIAGNOSIS — F411 Generalized anxiety disorder: Principal | ICD-10-CM

## 2014-05-11 DIAGNOSIS — B20 Human immunodeficiency virus [HIV] disease: Secondary | ICD-10-CM

## 2014-05-11 MED ORDER — CLOBETASOL 0.05 % TOPICAL CREAM
TOPICAL_CREAM | Freq: Two times a day (BID) | CUTANEOUS | Status: DC
Start: 2014-05-11 — End: 2015-09-06

## 2014-05-11 MED ORDER — TIZANIDINE 4 MG CAPSULE
4.00 mg | ORAL_CAPSULE | Freq: Three times a day (TID) | ORAL | Status: DC | PRN
Start: 2014-05-11 — End: 2015-04-21

## 2014-05-11 NOTE — Progress Notes (Signed)
[facility]  [address]  [place] [id number]  [phone]        Patient name:  Mike [name]  MRN:  [id number]  DOB:  12/14/1960  DATE:  05/11/2014      Subjective:     Patient ID:  Mike [name] is an 53 y.o. male     Chief Complaint:    Chief Complaint   Patient presents with    Establish Care    Anxiety    Rash     Right armpit       HPI - Pt here to establish. He says that he suffers from fairly severe anxiety. He cannot take SSRI's due to them making him feel even more anxious. He also deals with depression a lot of the time.     He has a lot of neck pain from cervical herniated discs. He is on muscle relaxers for it.     He has a chronic rash that started around his navel. He was started on triamcinolone cream and it helped after a couple of weeks. Then a similar rash appeared on his neck. It is extremely pruritic. That spot has improved but he now has something beneath his right armpit. He rarely wears deodorant. He never uses antiperspirant.     Past Medical History   Diagnosis Date    Anxiety     HIV (human immunodeficiency virus infection)     Cervical herniated disc     Pilonidal cyst     Environmental allergies     Asthma     Rash     Depression          Past Surgical History   Procedure Laterality Date    Hx cyst incision and drainage       pilonidal           Family History   Problem Relation Age of Onset    Coronary Artery Disease      Cancer      Stroke             History     Social History    Marital Status: Single     Spouse Name: N/A     Number of Children: N/A    Years of Education: N/A     Occupational History    Disabled      HIV    Contractor      Social History Main Topics    Smoking status: Current Every Day Smoker -- 2.00 packs/day for 40 years     Types: Cigarettes     Last Attempt to Quit: 12/25/2012    Smokeless tobacco: Never Used    Alcohol Use: No    Drug Use: 2.00 per week     Special: Marijuana    Sexual Activity: Not  on file     Other Topics Concern    Not on file     Social History Narrative      Review of Systems   Constitutional: Negative for fever and chills.   HENT: Negative for congestion and hearing loss.    Eyes: Negative for visual disturbance.   Respiratory: Positive for shortness of breath.    Cardiovascular: Negative for chest pain.   Gastrointestinal: Negative for nausea, vomiting and diarrhea.   Endocrine: Negative for polyuria.   Genitourinary: Positive for difficulty urinating (mild, recent prostatitis). Negative for dysuria and urgency.   Musculoskeletal: Positive for back pain and neck  pain. Negative for joint swelling and arthralgias.   Skin: Positive for rash.   Neurological: Negative for dizziness and headaches.   Hematological: Does not bruise/bleed easily.   Psychiatric/Behavioral: Negative for dysphoric mood. The patient is not nervous/anxious.        Objective:     Physical Exam   Constitutional: He is oriented to person, place, and time. He appears well-developed and well-nourished.   HENT:   Head: Normocephalic and atraumatic.   Neck: Normal range of motion. Neck supple.   Cardiovascular: Normal rate, regular rhythm and normal heart sounds.    Pulmonary/Chest: Effort normal and breath sounds normal.   Musculoskeletal: Normal range of motion.   Neurological: He is alert and oriented to person, place, and time.   Skin: Skin is warm and dry. No rash noted. No erythema. No pallor.   Psychiatric: He has a normal mood and affect. His behavior is normal. Judgment and thought content normal.        Ortho Exam    Assessment & Plan:       ICD-10-CM    1. 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 Body mass index (BMI) of 19 or less in adult Z68.1    3. HIV (human immunodeficiency virus infection) Z21    4. Chronic cervical pain M54.2 Tizanidine (ZANAFLEX) 4 mg Oral Capsule    G89.29    5. Weight loss R63.4    6. 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

## 2014-05-17 ENCOUNTER — Other Ambulatory Visit: Payer: Self-pay

## 2014-05-27 ENCOUNTER — Ambulatory Visit (INDEPENDENT_AMBULATORY_CARE_PROVIDER_SITE_OTHER): Payer: Self-pay | Admitting: Medical

## 2014-06-26 ENCOUNTER — Ambulatory Visit (INDEPENDENT_AMBULATORY_CARE_PROVIDER_SITE_OTHER): Payer: Medicare Other | Admitting: Family Medicine

## 2014-06-26 ENCOUNTER — Encounter (FREE_STANDING_LABORATORY_FACILITY)
Admit: 2014-06-26 | Discharge: 2014-06-26 | Disposition: A | Discharge status: Home / Self Care | Payer: Self-pay | Attending: Family Medicine | Admitting: Family Medicine

## 2014-06-26 ENCOUNTER — Encounter (INDEPENDENT_AMBULATORY_CARE_PROVIDER_SITE_OTHER): Payer: Self-pay | Admitting: Family Medicine

## 2014-06-26 VITALS — BP 120/70 | HR 78 | Temp 98.6°F | Resp 20 | Ht 74.0 in | Wt 153.4 lb

## 2014-06-26 DIAGNOSIS — M542 Cervicalgia: Principal | ICD-10-CM

## 2014-06-26 DIAGNOSIS — L218 Other seborrheic dermatitis: Secondary | ICD-10-CM

## 2014-06-26 DIAGNOSIS — R5383 Other fatigue: Secondary | ICD-10-CM

## 2014-06-26 DIAGNOSIS — Z125 Encounter for screening for malignant neoplasm of prostate: Secondary | ICD-10-CM

## 2014-06-26 DIAGNOSIS — G8929 Other chronic pain: Principal | ICD-10-CM

## 2014-06-26 DIAGNOSIS — J4520 Mild intermittent asthma, uncomplicated: Secondary | ICD-10-CM

## 2014-06-26 DIAGNOSIS — F411 Generalized anxiety disorder: Secondary | ICD-10-CM

## 2014-06-26 DIAGNOSIS — Z21 Asymptomatic human immunodeficiency virus [HIV] infection status: Secondary | ICD-10-CM

## 2014-06-26 DIAGNOSIS — B20 Human immunodeficiency virus [HIV] disease: Secondary | ICD-10-CM

## 2014-06-26 DIAGNOSIS — L219 Seborrheic dermatitis, unspecified: Secondary | ICD-10-CM

## 2014-06-26 LAB — COMPREHENSIVE METABOLIC PNL, FASTING
ALBUMIN: 4.5 gm/dL (ref 3.5–4.8)
ALKALINE PHOSPHATASE: 53 U/L (ref 20–130)
ALT (SGPT): 20 U/L (ref 4–36)
AST (SGOT): 32 U/L (ref 8–33)
BILIRUBIN, TOTAL: 0.5 mg/dL (ref 0.3–1.2)
BUN: 14 mg/dL (ref 8–20)
CALCIUM: 10 mg/dL (ref 8.9–10.3)
CARBON DIOXIDE: 30 mEq/L (ref 22–32)
CHLORIDE: 107 mEq/L (ref 101–111)
CREATININE: 0.9 mg/dL (ref 0.6–1.2)
ESTIMATED GLOMERULAR FILTRATION RATE: >60 — AB
GLUCOSE,NONFAST: 81 mg/dL (ref 70–110)
POTASSIUM: 4.5 meq/L (ref 3.6–5.1)
SODIUM: 147 mEq/L — ABNORMAL HIGH (ref 136–144)
TOTAL PROTEIN: 7 g/dL (ref 6.4–8.3)

## 2014-06-26 LAB — CBC/DIFF
BASOPHILS: 0.9 %
BASOS ABS: 0.1 10 (ref 0.00–0.20)
EOS ABS: 0.3 10 (ref 0.0–0.5)
EOS ABS: 0.3 10 (ref 0.0–0.5)
EOSINOPHIL: 3 %
HCT: 50.2 % (ref 38.9–50.5)
HGB: 17.1 g/dL (ref 13.4–17.3)
LYMPHOCYTES: 21.3 %
LYMPHS ABS: 1.8 10 (ref 0.8–3.2)
MCH: 31.3 pg (ref 27.9–33.1)
MCHC: 34 g/dL (ref 32.8–36.0)
MCV: 91.9 fl (ref 82.4–95.0)
MONOCYTES: 7.2 %
MONOS ABS: 0.6 10 (ref 0.2–0.8)
MPV: 11.5 fl — ABNORMAL HIGH (ref 6.0–10.2)
PLATELET COUNT (AUTO): 145 10 (ref 140–440)
PMN ABS (AUTO): 5.6 10 — ABNORMAL HIGH (ref 1.6–5.5)
PMN'S: 67.6 %
RBC: 5.46 10 (ref 4.40–5.68)
RDW: 14.9 % (ref 10.9–15.1)
WBC: 8.2 10 (ref 3.3–9.3)

## 2014-06-26 LAB — LIPID PANEL
CHOLESTEROL: 152 mg/dL (ref 0–199)
HDL-CHOLESTEROL: 39 mg/dL (ref 29–71)
LDL CHOLESTEROL,DIRECT: 97 mg/dL (ref 0–99)
TRIGLYCERIDES: 81 mg/dL (ref 0–199)
VLDL (CALCULATED): 16 mg/dL (ref 0–50)

## 2014-06-26 LAB — VITAMIN B12: VITAMIN B12: 217 pg/mL (ref 180–914)

## 2014-06-26 LAB — URINALYSIS, MACROSCOPIC
BILIRUBIN: NEGATIVE
BLOOD: NEGATIVE
GLUCOSE: NEGATIVE
NITRITE: NEGATIVE
PH URINE: 6.5 (ref 5.0–7.0)
PROTEIN: NEGATIVE
SPECIFIC GRAVITY, URINE: 1.01 (ref 1.010–1.025)
UROBILINOGEN: 0.2 U/dL (ref 0.0–1.0)

## 2014-06-26 LAB — PSA SCREENING: PROSTATE SPECIFIC AG: 1.3 ng/mL (ref 0.0–4.0)

## 2014-06-26 LAB — THYROID STIMULATING HORMONE (SENSITIVE TSH): TSH: 2.57 u[IU]/mL (ref 0.340–5.600)

## 2014-06-26 LAB — THYROXINE, FREE (FREE T4): THYROXINE, FREE (FREE T4): 1.11 ng/dL (ref 0.61–1.12)

## 2014-06-26 MED ORDER — TIZANIDINE 4 MG CAPSULE
4.0000 mg | ORAL_CAPSULE | Freq: Three times a day (TID) | ORAL | Status: DC | PRN
Start: 2014-06-26 — End: 2014-09-19

## 2014-06-26 MED ORDER — CLOBETASOL 0.05 % SCALP SOLUTION
Freq: Two times a day (BID) | Status: DC
Start: 2014-06-26 — End: 2015-09-06

## 2014-06-26 MED ORDER — MONTELUKAST 10 MG TABLET
10.00 mg | ORAL_TABLET | Freq: Every evening | ORAL | Status: DC
Start: 2014-06-26 — End: 2014-12-23

## 2014-06-26 MED ORDER — CLONAZEPAM 0.5 MG TABLET
0.50 mg | ORAL_TABLET | Freq: Two times a day (BID) | ORAL | Status: DC
Start: 2014-06-26 — End: 2014-09-19

## 2014-06-26 NOTE — Progress Notes (Signed)
[facility]  [address]  [place] [id number]  [phone]        Patient name:  Mike RoofJeffrey [name]  MRN:  [id number]  DOB:  02-03-1961  DATE:  06/26/2014      Subjective:     Patient ID:  Mike [name] is an 53 y.o. male     Chief Complaint:    Chief Complaint   Patient presents with    Anxiety     6 week follow up / fasting        HPI - patient is here for followup. Anxiety is still a problem. He notes much less back pain with the muscle relaxer. He is fasting for blood work today. Diagnosed by dermatology some time ago with seborrheic dermatitis of the scalp, he was given clobetasol solution for it, and he says it worked very well, he has tried cold tar shampoo with minimal success. He also notes that he used to treated with Singulair for his asthma. He says it worked very well help his breathing.     Review of Systems   Constitutional: Negative for fever and chills.   Respiratory: Negative for shortness of breath.    Cardiovascular: Negative for chest pain.   Gastrointestinal: Negative for nausea, vomiting and diarrhea.   Psychiatric/Behavioral: Negative for dysphoric mood. The patient is not nervous/anxious.        Objective:     Physical Exam   Constitutional: He is oriented to person, place, and time. He appears well-developed.   Patient is a thin-appearing   HENT:   Head: Normocephalic and atraumatic.   Neck: Normal range of motion. Neck supple.   Cardiovascular: Normal rate, regular rhythm and normal heart sounds.    Pulmonary/Chest: Effort normal and breath sounds normal.   Musculoskeletal: Normal range of motion.   Neurological: He is alert and oriented to person, place, and time.   Skin: Skin is warm and dry. No rash noted. No erythema. No pallor.   Psychiatric: He has a normal mood and affect. His behavior is normal. Judgment and thought content normal.        Ortho Exam    Assessment & Plan:       ICD-10-CM    1. Chronic cervical pain M54.2 Tizanidine (ZANAFLEX) 4 mg Oral  Capsule    G89.29    2. 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 Asthma, mild intermittent, uncomplicated J45.20 montelukast (SINGULAIR) 10 mg Oral Tablet   4. 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 HIV (human immunodeficiency virus infection) Z21 VITAMIN B12     VITAMIN D 25 HYDROXY     CD4/CD8     VITAMIN B12     VITAMIN D 25 HYDROXY     CD4/CD8   6. Fatigue R53.83 CBC/DIFF     COMPREHENSIVE METABOLIC PNL, FASTING     THYROID STIMULATING HORMONE (SENSITIVE TSH)     THYROXINE, FREE (FREE T4)     URINALYSIS, MACROSCOPIC     CBC/DIFF     COMPREHENSIVE METABOLIC PNL, FASTING     THYROID STIMULATING HORMONE (SENSITIVE TSH)     THYROXINE, FREE (FREE T4)     URINALYSIS, MACROSCOPIC   7. Prostate cancer screening Z12.5 PSA SCREENING     PSA SCREENING             Flonnie OvermanKristian M  Randol Kern, MD

## 2014-06-29 LAB — CD4/CD8
ABSOLUTE CD4 COUNT: 600 cells/ul (ref 382–1614)
ABSOLUTE CD8 COUNT: 819 cells/ul — ABNORMAL HIGH (ref 157–813)
ABSOLUTE T LYMPH COUNT: 1418 cells/ul (ref 892–2436)
CD3: 82 %
CD4: 35 %
CD4:CD8 RATIO: 0.7 — ABNORMAL LOW (ref 1.0–3.4)
CD8: 48 %

## 2014-09-19 ENCOUNTER — Other Ambulatory Visit (INDEPENDENT_AMBULATORY_CARE_PROVIDER_SITE_OTHER): Payer: Self-pay | Admitting: Family Medicine

## 2014-09-21 NOTE — Telephone Encounter (Signed)
Please accept and sign  Lavilla Delamora, MA

## 2014-10-01 ENCOUNTER — Ambulatory Visit (INDEPENDENT_AMBULATORY_CARE_PROVIDER_SITE_OTHER): Payer: Medicare Other | Admitting: Family Medicine

## 2014-10-01 ENCOUNTER — Encounter (INDEPENDENT_AMBULATORY_CARE_PROVIDER_SITE_OTHER): Payer: Self-pay | Admitting: Family Medicine

## 2014-10-01 VITALS — BP 102/60 | HR 86 | Temp 96.8°F | Resp 16 | Ht 74.0 in | Wt 136.1 lb

## 2014-10-01 DIAGNOSIS — R300 Dysuria: Principal | ICD-10-CM

## 2014-10-01 DIAGNOSIS — R634 Abnormal weight loss: Secondary | ICD-10-CM

## 2014-10-01 DIAGNOSIS — Z681 Body mass index (BMI) 19 or less, adult: Secondary | ICD-10-CM

## 2014-10-01 DIAGNOSIS — Z23 Encounter for immunization: Secondary | ICD-10-CM

## 2014-10-01 LAB — URINALYSIS, MICROSCOPIC
BILIRUBIN: NEGATIVE
BLOOD: NEGATIVE
GLUCOSE: NEGATIVE
KETONES: NEGATIVE
NITRITE: NEGATIVE
PH URINE: 7.5 — ABNORMAL HIGH (ref 5.0–7.0)
PROTEIN: NEGATIVE
SPECIFIC GRAVITY, URINE: 1.014 (ref 1.010–1.025)
UROBILINOGEN: 0.2 E.U./dl (ref 0.0–1.0)

## 2014-10-01 MED ORDER — CYANOCOBALAMIN (VIT B-12) 1,000 MCG SUBLINGUAL TABLET
1.0000 | SUBLINGUAL_TABLET | Freq: Every day | SUBLINGUAL | Status: DC
Start: 2014-10-01 — End: 2015-09-06

## 2014-10-01 MED ORDER — BUPROPION HCL SR 150 MG TABLET,12 HR SUSTAINED-RELEASE
150.00 mg | ORAL_TABLET | Freq: Two times a day (BID) | ORAL | Status: DC
Start: 2014-10-01 — End: 2015-09-06

## 2014-10-01 NOTE — Progress Notes (Addendum)
[facility]  Progress Note  Mike RoofJeffrey [name]  Date of service: 10/01/2014    CC:  Patient presents to clinic for follow up clinic    Subjective:  Patient presents with 17 lbs of unexpected weight loss since last visit 3 months ago.  Reports unchanged appetite; has coffee in the morning, regular 4pm meal with multiple snacks throughout the day.  No new medications except for Klonopin for anxiety prescribed last visit.  Patient endorses stress due to money problems that have lately been especially bad because of upcoming tax deadlines.  He reports previous weight loss with illnesses and states that he is never able to regain this weight.  He has been supplementing his diet with Ensure (2 bottles daily).      Klonopin has been "wonderful. "  Much decreased anxiety.  Still occasional symptoms, but now very manageable.      Neck/beard itching is intermittent since last visit and has been improving.  Occasionally uses Clobetasol, and is having relief after using conditioner on his beard.    Reports itching of urethra and prostate that started one week ago.  No dysuria.  Clear discharge, no blood in urine or stool.  No urinary frequency.  Hx prostatitis a few months ago, symptoms resolved with antibiotics. No new sexual partners, rash.    Patient currently smokes 4ppd.  Still desires to quit but has had problems with insurance in getting smoking cessation products.     Medications:  Current Outpatient Prescriptions   Medication Sig    ADVAIR DISKUS 250-50 mcg/dose Inhalation Disk with Device oral diskus inhaler INHALE 1 PUFF 2 TIMES A DAY (Patient not taking: Reported on 05/11/2014)    albuterol sulfate (PROAIR HFA) 90 mcg/actuation Inhalation HFA Aerosol Inhaler INHALE 2 PUFFS BY INHALATION EVERY 6 HOURS AS NEEDED.    buPROPion (WELLBUTRIN SR) 150 mg Oral Tablet Sustained Release Take 1 Tab (150 mg total) by mouth Twice daily    Clobetasol (TEMOVATE) 0.05 % Cream Apply topically Twice daily     Clobetasol (TEMOVATE) 0.05 % Solution Apply topically Twice daily    clonazePAM (KLONOPIN) 0.5 mg Oral Tablet TAKE 1 TABLET BY MOUTH 2 TIMES A DAY    Cyanocobalamin 1,000 mcg Sublingual Tablet, Sublingual 1 Tab (1,000 mcg total) by Sublingual route Once a day    MOMETASONE/FORMOTEROL (DULERA INHL) Take 2 INHALATION by inhalation Twice daily    montelukast (SINGULAIR) 10 mg Oral Tablet Take 1 Tab (10 mg total) by mouth Every evening    multivitamin Oral Tablet Take 1 Tab by mouth Once a day    naproxen (NAPROSYN) 500 mg Oral Tablet Take 1 Tab (500 mg total) by mouth Twice daily with food    SAW/PYGEUM/NETTLE/PUMPKN/AA#17 (PROSTATE HEALTH FORMULA ORAL) Take 3 Tabs by mouth Once a day    TIVICAY 50 mg Oral Tablet TAKE 1 TABLET BY MOUTH ONCE DAILY    tiZANidine (ZANAFLEX) 4 mg Oral Tablet TAKE 1 TABLET BY MOUTH 3 TIMES A DAY AS NEEDED    TRUVADA 200-300 mg Oral Tablet TAKE 1 TABLET BY MOUTH ONCE DAILY     ROS:   Constitutional: No fever, chills or weakness   Skin: No rash or diaphoresis  HENT: No headaches or congestion  Eyes: No vision changes   Cardio: No chest pain, palpitations or leg swelling   Respiratory: No cough, wheezing or SOB  Endo: No polyuria, polydipsia, heat/cold sensitivity  GI:  No nausea, vomiting or stool changes  GU:  Itching in  urethra  Psychiatric: No depression    Physical Exam:  Most Recent Vitals       Office Visit from 10/01/2014 in [facility]    Temperature 36 C (96.8 F) filed at... 10/01/2014 1319    Heart Rate 86 filed at... 10/01/2014 1319    Respiratory Rate 16 filed at... 10/01/2014 1319    BP (Non-Invasive) 102/60 mmHg filed at... 10/01/2014 1319    Height 1.88 m ( ) filed at... 10/01/2014 1319    Weight 61.735 kg (136 lb 1.6 oz) filed at... 10/01/2014 1319    BMI (Calculated)     BSA (Calculated)         General: Patient appears thin, stated age, in no acute distress  Eyes: Conjunctiva clear; pupils equal and round; sclera non-icteric.   HENT: Head atraumatic  and normocephalic; mouth mucous membranes moist.   Lungs: Wheezes in BL lower lung fields. No crackles.  Cardiovascular: Regular rate and rhythm; normal S1, S2, no murmur, click, rub, or gallop.  Abdomen: Soft, non-tender, non-distended; bowel sounds normal.  Genito-urinary: Deferred.  Extremities: No cyanosis or edema.  Skin: Skin pink, warm, and dry.  Neurologic: Alert and oriented x3.  Lymphatics: No lymphadenopathy.  Psychiatric: Normal affect, behavior, memory, thought content, judgement, and speech.    Assessment:   Mike [name] is a 54 y.o. male who presents for a follow up visit    Plan:     Unexpected weight loss  - Given extensive hx smoking and hx PCP pneumonia, HIV, will first obtain CXR     Urethral itching  - U/A with culture     Smoking cessation  - Will start Wellbutrin today    General Health Maintenance  - Will vaccinate for Hep B  - PPD test tomorrow      Orders Placed This Encounter    URINE CULTURE,ROUTINE    XR CHEST PA AND LATERAL    Hepatitis B Virus Recomb Vaccine  Adult (Admin)    URINALYSIS WITH CULTURE REFLEX    URINALYSIS, MICROSCOPIC    Cyanocobalamin 1,000 mcg Sublingual Tablet, Sublingual    buPROPion (WELLBUTRIN SR) 150 mg Oral Tablet Sustained Release           Flora Lipps, MSIV  10/01/2014, 13:29        ICD-10-CM    1. Dysuria R30.0 URINALYSIS WITH CULTURE REFLEX     URINALYSIS, MICROSCOPIC     URINE CULTURE,ROUTINE     URINALYSIS WITH CULTURE REFLEX     URINALYSIS, MICROSCOPIC     URINE CULTURE,ROUTINE   2. Body mass index (BMI) of 19 or less in adult Z68.1    3. Weight loss R63.4 XR CHEST PA AND LATERAL     XR CHEST PA AND LATERAL   4. Immunization due Z23 HEP B VIRUS RECOMB VACCINE(ADMIN) ADULT        I have personally seen and examined this patient and reviewed the H&P/ Findings/ Assessment/ Plan of the Medical Student & agree with the said documentation.    Randell Patient, MD

## 2014-10-02 ENCOUNTER — Ambulatory Visit (INDEPENDENT_AMBULATORY_CARE_PROVIDER_SITE_OTHER): Payer: Medicare Other

## 2014-10-02 DIAGNOSIS — Z111 Encounter for screening for respiratory tuberculosis: Principal | ICD-10-CM

## 2014-10-05 ENCOUNTER — Ambulatory Visit (INDEPENDENT_AMBULATORY_CARE_PROVIDER_SITE_OTHER): Payer: Medicare Other

## 2014-10-05 DIAGNOSIS — Z111 Encounter for screening for respiratory tuberculosis: Secondary | ICD-10-CM

## 2014-10-05 DIAGNOSIS — Z7689 Persons encountering health services in other specified circumstances: Secondary | ICD-10-CM

## 2014-10-05 NOTE — Progress Notes (Signed)
PPD placed (L) forearm. Tolerated well.  Helen Hashimotoindy Creedence Kunesh, [place]

## 2014-10-08 NOTE — Progress Notes (Signed)
PPD - Negative  Copy scanned into Care[employer] information officermedia manager and original given to pt  Gwynne Edingerosalee Reynolds Kittel, MA

## 2014-10-13 ENCOUNTER — Ambulatory Visit (INDEPENDENT_AMBULATORY_CARE_PROVIDER_SITE_OTHER): Payer: Medicare Other | Admitting: Family

## 2014-10-13 VITALS — HR 80 | Temp 98.3°F

## 2014-10-13 DIAGNOSIS — J069 Acute upper respiratory infection, unspecified: Principal | ICD-10-CM

## 2014-10-13 MED ORDER — LORATADINE 10 MG TABLET
10.0000 mg | ORAL_TABLET | Freq: Every day | ORAL | Status: DC
Start: 2014-10-13 — End: 2015-09-06

## 2014-10-13 NOTE — Progress Notes (Signed)
[facility]  [address]  [place] [id number]  [phone]        Patient name:  Mike RoofJeffrey [name]  MRN:  [id number]  DOB:  10/19/1960  DATE:  10/13/2014      Subjective:     Patient ID:  Mike [name] is an 54 y.o. male     Chief Complaint:  No chief complaint on file.      HPI Mike [name] is here today for a sick visit. He has been sick for 3 days and feels about the same. He reports sore throat and ear pain. He has not tried any OTC medication. He denies fevers, chills or body aches.     Review of Systems   Constitutional: Negative for fever, chills, appetite change and fatigue.   HENT: Positive for congestion, ear pain, rhinorrhea and sore throat. Negative for ear discharge, mouth sores, sinus pressure, sneezing and trouble swallowing.    Respiratory: Negative for cough, shortness of breath and wheezing.    Cardiovascular: Negative for chest pain.   Gastrointestinal: Negative for nausea, vomiting, abdominal pain and diarrhea.   Skin: Negative for rash.   Neurological: Negative for headaches.       Objective:   Pulse 80   Temp(Src) 36.8 C (98.3 F)   SpO2 96%     Physical Exam   Constitutional: He appears well-developed and well-nourished. No distress.   HENT:   Head: Normocephalic and atraumatic.   Right Ear: Tympanic membrane normal.   Left Ear: Tympanic membrane normal.   Nose: Nose normal.   Mouth/Throat: Oropharynx is clear and moist. No oropharyngeal exudate, posterior oropharyngeal edema or posterior oropharyngeal erythema.   Cardiovascular: Normal rate, regular rhythm and normal heart sounds.    Pulmonary/Chest: Effort normal and breath sounds normal. No accessory muscle usage. No respiratory distress.   Lymphadenopathy:     He has no cervical adenopathy.   Skin: No rash noted. He is not diaphoretic.        Ortho Exam    Assessment & Plan:       ICD-10-CM    1. URI (upper respiratory infection) J06.9      Orders Placed This Encounter    loratadine (CLARITIN) 10 mg Oral Tablet      Follow up if worsening or fever develops.   Try warm salt water gargles or tylenol for fever.          Bonnita LevanSarah Deondre Marinaro, NP

## 2014-10-29 ENCOUNTER — Ambulatory Visit (INDEPENDENT_AMBULATORY_CARE_PROVIDER_SITE_OTHER): Payer: Medicare Other

## 2014-10-29 DIAGNOSIS — Z23 Encounter for immunization: Secondary | ICD-10-CM

## 2014-11-26 ENCOUNTER — Encounter (INDEPENDENT_AMBULATORY_CARE_PROVIDER_SITE_OTHER): Payer: Medicare Other | Admitting: Family Medicine

## 2014-12-23 ENCOUNTER — Other Ambulatory Visit (INDEPENDENT_AMBULATORY_CARE_PROVIDER_SITE_OTHER): Payer: Self-pay | Admitting: Family Medicine

## 2014-12-24 ENCOUNTER — Other Ambulatory Visit (INDEPENDENT_AMBULATORY_CARE_PROVIDER_SITE_OTHER): Payer: Self-pay

## 2014-12-24 MED ORDER — MONTELUKAST 10 MG TABLET
ORAL_TABLET | ORAL | Status: DC
Start: 2014-12-24 — End: 2015-06-25

## 2014-12-24 MED ORDER — TIZANIDINE 4 MG TABLET
ORAL_TABLET | ORAL | Status: DC
Start: 2014-12-24 — End: 2015-06-02

## 2014-12-24 MED ORDER — CLONAZEPAM 0.5 MG TABLET
ORAL_TABLET | ORAL | Status: DC
Start: 2014-12-24 — End: 2015-03-25

## 2014-12-24 NOTE — Telephone Encounter (Signed)
Rx pending please sign and accept thanks Abdul Beirne

## 2014-12-24 NOTE — Telephone Encounter (Signed)
Please accept and sign  Quiara Killian, MA

## 2014-12-28 ENCOUNTER — Encounter (INDEPENDENT_AMBULATORY_CARE_PROVIDER_SITE_OTHER): Payer: Medicare Other | Admitting: Family Medicine

## 2014-12-28 ENCOUNTER — Telehealth (INDEPENDENT_AMBULATORY_CARE_PROVIDER_SITE_OTHER): Payer: Self-pay

## 2014-12-28 NOTE — Telephone Encounter (Signed)
Patient called today to cancel appt because of back injury he did 3 weeks ago. He did not go to ER. 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 He will call for follow up when he can drive.   He states he has a pinched nerve and nothing can be done. , Rich Braveonna Maddalynn Barnard, MA

## 2015-02-11 ENCOUNTER — Ambulatory Visit (INDEPENDENT_AMBULATORY_CARE_PROVIDER_SITE_OTHER): Payer: Medicare Other | Admitting: Family Medicine

## 2015-02-11 ENCOUNTER — Encounter (INDEPENDENT_AMBULATORY_CARE_PROVIDER_SITE_OTHER): Payer: Self-pay | Admitting: Family Medicine

## 2015-02-11 VITALS — BP 120/90 | HR 88 | Temp 97.6°F | Resp 14 | Wt 135.1 lb

## 2015-02-11 DIAGNOSIS — Z681 Body mass index (BMI) 19 or less, adult: Secondary | ICD-10-CM

## 2015-02-11 DIAGNOSIS — Z21 Asymptomatic human immunodeficiency virus [HIV] infection status: Secondary | ICD-10-CM

## 2015-02-11 DIAGNOSIS — B20 Human immunodeficiency virus [HIV] disease: Secondary | ICD-10-CM

## 2015-02-11 DIAGNOSIS — J4520 Mild intermittent asthma, uncomplicated: Secondary | ICD-10-CM

## 2015-02-11 DIAGNOSIS — N411 Chronic prostatitis: Secondary | ICD-10-CM

## 2015-02-11 DIAGNOSIS — F411 Generalized anxiety disorder: Secondary | ICD-10-CM

## 2015-02-11 MED ORDER — TAMSULOSIN 0.4 MG CAPSULE
0.40 mg | ORAL_CAPSULE | Freq: Every evening | ORAL | Status: DC
Start: 2015-02-11 — End: 2015-09-06

## 2015-02-11 MED ORDER — CIPROFLOXACIN 500 MG TABLET
500.00 mg | ORAL_TABLET | Freq: Two times a day (BID) | ORAL | Status: DC
Start: 2015-02-11 — End: 2015-06-02

## 2015-02-11 NOTE — Progress Notes (Signed)
[facility]  [address]  [place] [address]  Phone: [phone]  Fax: [phone]        Patient name:  Mike [name]  MRN:  [id number]  DOB:  [birth year]  DATE:  02/11/2015      Subjective:     Patient ID:  Mike [name] is an 54 y.o. male     Chief Complaint:    Chief Complaint   Patient presents with    Urinary Pain     follow up appointment       HPI - Pt here for follow up. He says that he is still having trouble with starting his urine stream in the morning. 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 Later in the day, he tends to have less issues. Anxiety is well controlled. He is following with ID for his HIV. He has cut back on his smoking some, but has not yet quit completely.     Review of Systems   Constitutional: Negative for fever and chills.   Respiratory: Negative for shortness of breath.    Cardiovascular: Negative for chest pain.   Gastrointestinal: Negative for nausea, vomiting and diarrhea.   Psychiatric/Behavioral: Negative for dysphoric mood. The patient is not nervous/anxious.        Objective:     Physical Exam   Constitutional: He is oriented to person, place, and time. He appears well-developed and well-nourished.   HENT:   Head: Normocephalic and atraumatic.   Neck: Normal range of motion. Neck supple.   Cardiovascular: Normal rate, regular rhythm and normal heart sounds.    Pulmonary/Chest: Effort normal and breath sounds normal.   Musculoskeletal: Normal range of motion.   Neurological: He is alert and oriented to person, place, and time.   Skin: Skin is warm and dry. No rash noted. No erythema. No pallor.   Psychiatric: He has a normal mood and affect. His behavior is normal. Judgment and thought content normal.        Ortho Exam    Assessment & Plan:       ICD-10-CM    1. 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 Release 24 hr     ciprofloxacin HCl (CIPRO) 500 mg Oral Tablet   2. Body mass index (BMI) of 19 or  less in adult Z68.1    3. Generalized anxiety disorder F41.1 - cont Klonopin   4. 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 HIV (human immunodeficiency virus infection) Z21 - cont to follow with ID     follow-up as scheduled         Randell Patient, MD

## 2015-02-18 DIAGNOSIS — E559 Vitamin D deficiency, unspecified: Secondary | ICD-10-CM

## 2015-02-18 DIAGNOSIS — B20 Human immunodeficiency virus [HIV] disease: Secondary | ICD-10-CM

## 2015-03-01 DIAGNOSIS — Z681 Body mass index (BMI) 19 or less, adult: Secondary | ICD-10-CM

## 2015-03-01 DIAGNOSIS — B20 Human immunodeficiency virus [HIV] disease: Secondary | ICD-10-CM

## 2015-03-02 ENCOUNTER — Encounter (INDEPENDENT_AMBULATORY_CARE_PROVIDER_SITE_OTHER): Payer: Self-pay | Admitting: Infectious Disease

## 2015-03-02 NOTE — Progress Notes (Signed)
Informed by case manager Roni Delligatti that pt is no longer seen at [facility] but is in care with Dr. Laf[facility]. Will remove from CW. Westly Pam, RN  03/02/2015, 14:27

## 2015-03-23 DIAGNOSIS — B20 Human immunodeficiency virus [HIV] disease: Secondary | ICD-10-CM

## 2015-03-23 DIAGNOSIS — E559 Vitamin D deficiency, unspecified: Secondary | ICD-10-CM

## 2015-03-25 ENCOUNTER — Other Ambulatory Visit (INDEPENDENT_AMBULATORY_CARE_PROVIDER_SITE_OTHER): Payer: Self-pay

## 2015-03-25 MED ORDER — CLONAZEPAM 0.5 MG TABLET
ORAL_TABLET | ORAL | Status: DC
Start: 2015-03-25 — End: 2015-06-02

## 2015-03-25 NOTE — Telephone Encounter (Signed)
Rx pending please sign and accept thanks Jamera Vanloan

## 2015-03-26 ENCOUNTER — Ambulatory Visit (INDEPENDENT_AMBULATORY_CARE_PROVIDER_SITE_OTHER): Payer: Self-pay | Admitting: Medical

## 2015-04-21 ENCOUNTER — Other Ambulatory Visit (INDEPENDENT_AMBULATORY_CARE_PROVIDER_SITE_OTHER): Payer: Self-pay | Admitting: Family Medicine

## 2015-04-21 NOTE — Telephone Encounter (Signed)
Please accept and sign  Mike Vanwieren, MA

## 2015-04-22 ENCOUNTER — Other Ambulatory Visit (INDEPENDENT_AMBULATORY_CARE_PROVIDER_SITE_OTHER): Payer: Self-pay | Admitting: Family Medicine

## 2015-04-23 NOTE — Telephone Encounter (Signed)
Please accept and sign  Gregoria Selvy, MA

## 2015-05-14 ENCOUNTER — Encounter (INDEPENDENT_AMBULATORY_CARE_PROVIDER_SITE_OTHER): Payer: Medicare Other | Admitting: Family Medicine

## 2015-05-15 ENCOUNTER — Other Ambulatory Visit: Payer: Self-pay

## 2015-05-31 ENCOUNTER — Encounter (INDEPENDENT_AMBULATORY_CARE_PROVIDER_SITE_OTHER): Payer: Medicare Other | Admitting: Family Medicine

## 2015-06-02 ENCOUNTER — Ambulatory Visit (INDEPENDENT_AMBULATORY_CARE_PROVIDER_SITE_OTHER): Payer: Medicare Other | Admitting: Family Medicine

## 2015-06-02 ENCOUNTER — Encounter (INDEPENDENT_AMBULATORY_CARE_PROVIDER_SITE_OTHER): Payer: Self-pay | Admitting: Family Medicine

## 2015-06-02 VITALS — BP 126/86 | HR 78 | Temp 96.6°F | Resp 16 | Ht 74.0 in | Wt 150.1 lb

## 2015-06-02 DIAGNOSIS — Z681 Body mass index (BMI) 19 or less, adult: Secondary | ICD-10-CM

## 2015-06-02 DIAGNOSIS — R21 Rash and other nonspecific skin eruption: Secondary | ICD-10-CM

## 2015-06-02 DIAGNOSIS — J0110 Acute frontal sinusitis, unspecified: Secondary | ICD-10-CM

## 2015-06-02 DIAGNOSIS — M62838 Other muscle spasm: Secondary | ICD-10-CM

## 2015-06-02 DIAGNOSIS — F411 Generalized anxiety disorder: Secondary | ICD-10-CM

## 2015-06-02 MED ORDER — METHOCARBAMOL 750 MG TABLET
1500.00 mg | ORAL_TABLET | Freq: Three times a day (TID) | ORAL | Status: DC | PRN
Start: 2015-06-02 — End: 2015-06-10

## 2015-06-02 MED ORDER — CEFDINIR 300 MG CAPSULE
300.00 mg | ORAL_CAPSULE | Freq: Two times a day (BID) | ORAL | Status: DC
Start: 2015-06-02 — End: 2015-09-06

## 2015-06-02 MED ORDER — CLONAZEPAM 1 MG TABLET
1.0000 mg | ORAL_TABLET | Freq: Two times a day (BID) | ORAL | Status: DC
Start: 2015-06-02 — End: 2015-09-06

## 2015-06-02 NOTE — Progress Notes (Signed)
[facility]  [address]  [place] [id number]  Phone: [phone]  Fax: [phone]        Patient name:  Mike [name]  MRN:  [id number]  DOB:  25-Oct-1960  DATE:  06/02/2015      Subjective:     Patient ID:  Mike [name] is an 54 y.o. male     Chief Complaint:    Chief Complaint   Patient presents with    Anxiety       HPI - Pt here for follow up. He says that his anxiety I not as well controlled as it used to be. The Klonopin do not last very long and he would like something a little stronger if possible. He also notes that he is not sleeping well and he would like something that will help him sleep but also help to relax his muscles while he is asleep. Zanaflex no longer helping.     He also had a sore throat recently and he feels a crackling in his left ear now. He says that it hurt to swallow for 1-2 days, now it feels like a dull pain in his ear.     Review of Systems   Constitutional: Negative for fever and chills.   Respiratory: Negative for shortness of breath.    Cardiovascular: Negative for chest pain.   Gastrointestinal: Negative for nausea, vomiting and diarrhea.   Psychiatric/Behavioral: Negative for dysphoric mood. The patient is not nervous/anxious.        Objective:     Physical Exam   Constitutional: He is oriented to person, place, and time. He appears well-developed and well-nourished.   HENT:   Head: Normocephalic and atraumatic.   Neck: Normal range of motion. Neck supple.   Cardiovascular: Normal rate, regular rhythm and normal heart sounds.    Pulmonary/Chest: Effort normal and breath sounds normal.   Musculoskeletal: Normal range of motion.   Neurological: He is alert and oriented to person, place, and time.   Skin: Skin is warm and dry. No rash noted. No erythema. No pallor.   Psychiatric: He has a normal mood and affect. His behavior is normal. Judgment and thought content normal.        Ortho Exam    Assessment & Plan:       ICD-10-CM    1. Rash R21 External Refer to:  (Type in Specialty)   2. Body mass index (BMI) 19 or less, adult Z68.1    3. Night muscle spasms M62.838 methocarbamol (ROBAXIN) 750 mg Oral Tablet   4. 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 Acute non-recurrent frontal sinusitis J01.10 cefdinir (OMNICEF) 300 mg Oral Capsule        follow-up in 3 months       Randell PatientKristian M Irving Lubbers, MD

## 2015-06-04 ENCOUNTER — Encounter (INDEPENDENT_AMBULATORY_CARE_PROVIDER_SITE_OTHER): Payer: Self-pay | Admitting: Family Medicine

## 2015-06-10 ENCOUNTER — Other Ambulatory Visit (INDEPENDENT_AMBULATORY_CARE_PROVIDER_SITE_OTHER): Payer: Self-pay

## 2015-06-10 MED ORDER — TIZANIDINE 4 MG TABLET
4.00 mg | ORAL_TABLET | Freq: Three times a day (TID) | ORAL | Status: DC
Start: 2015-06-10 — End: 2015-09-06

## 2015-06-10 NOTE — Telephone Encounter (Signed)
Insurance will not cover Robaxin  Per Dr. Jon BillingsMorrison rx changed to Zanaflex  Please sign and accept.  Gwynne Edingerosalee Laura Radilla, MA

## 2015-06-25 ENCOUNTER — Other Ambulatory Visit (INDEPENDENT_AMBULATORY_CARE_PROVIDER_SITE_OTHER): Payer: Self-pay

## 2015-06-25 MED ORDER — MONTELUKAST 10 MG TABLET
ORAL_TABLET | ORAL | Status: DC
Start: 2015-06-25 — End: 2015-09-06

## 2015-06-25 NOTE — Telephone Encounter (Signed)
Please accept and sign pending Rx.  Roshni Burbano, MA

## 2015-07-24 NOTE — Progress Notes (Signed)
Vaccine not available - refer to [facility]

## 2015-08-12 ENCOUNTER — Ambulatory Visit (INDEPENDENT_AMBULATORY_CARE_PROVIDER_SITE_OTHER): Payer: Medicare Other

## 2015-08-12 DIAGNOSIS — B20 Human immunodeficiency virus [HIV] disease: Secondary | ICD-10-CM

## 2015-08-12 LAB — CBC/DIFF
BASOPHILS: 1.2 %
BASOS ABS: 0.1 10 (ref 0.00–0.20)
EOS ABS: 0.2 10 (ref 0.0–0.5)
EOSINOPHIL: 2.8 %
HCT: 45.7 % (ref 38.9–50.5)
HGB: 15.5 g/dL (ref 13.4–17.3)
LYMPHOCYTES: 19.1 %
LYMPHS ABS: 1.2 10 (ref 0.8–3.2)
MCH: 29.8 pg (ref 27.9–33.1)
MCHC: 34 g/dL (ref 32.8–36.0)
MCV: 87.4 fl (ref 82.4–95.0)
MONOCYTES: 8.7 %
MONOS ABS: 0.5 10 (ref 0.2–0.8)
MPV: 10.9 fl — ABNORMAL HIGH (ref 6.0–10.2)
PLATELET COUNT: 151 10 (ref 140–440)
PMN ABS: 4.1 10 (ref 1.6–5.5)
PMN'S: 68.2 %
PMN'S: 68.2 %
RBC: 5.22 10 (ref 4.40–5.68)
RDW: 14.2 % (ref 10.9–15.1)
WBC: 6.1 10 (ref 3.3–9.3)

## 2015-08-12 LAB — COMPREHENSIVE METABOLIC PANEL, NON-FASTING
ALBUMIN: 3.7 g/dL (ref 3.5–4.8)
ALKALINE PHOSPHATASE: 55 U/L (ref 20–130)
ALT (SGPT): 12 U/L (ref 4–36)
AST (SGOT): 21 U/L (ref 8–33)
BILIRUBIN, TOTAL: 0.4 mg/dL (ref 0.3–1.2)
BUN: 10 mg/dL (ref 8–20)
CALCIUM: 8.8 mg/dL — ABNORMAL LOW (ref 8.9–10.3)
CHLORIDE: 101 meq/L (ref 101–111)
CO2 TOTAL: 24 mEq/L (ref 22–32)
CREATININE: 0.8 mg/dL (ref 0.6–1.2)
ESTIMATED GFR: >60 — AB
GLUCOSE: 102 mg/dL (ref 70–110)
POTASSIUM: 4.1 mEq/L (ref 3.6–5.1)
SODIUM: 134 mEq/L — ABNORMAL LOW (ref 136–144)
TOTAL PROTEIN: 6 g/dL — ABNORMAL LOW (ref 6.4–8.3)

## 2015-08-13 ENCOUNTER — Encounter (FREE_STANDING_LABORATORY_FACILITY)
Admit: 2015-08-13 | Discharge: 2015-08-13 | Disposition: A | Discharge status: Home / Self Care | Payer: Self-pay | Attending: Internal Medicine | Admitting: Internal Medicine

## 2015-08-13 ENCOUNTER — Other Ambulatory Visit (FREE_STANDING_LABORATORY_FACILITY): Payer: Self-pay | Admitting: Internal Medicine

## 2015-08-13 LAB — HEPATITIS C ANTIBODY SCREEN WITH REFLEX TO HCV PCR
HEPATITIS C ANTIBODY: NEGATIVE
HEPATITIS C ANTIBODY: NEGATIVE

## 2015-08-13 LAB — SYPHILIS, RAPID PLASMIN REAGIN (RPR), WITH TITER REFLEX: RPR: NONREACTIVE

## 2015-08-16 ENCOUNTER — Ambulatory Visit (INDEPENDENT_AMBULATORY_CARE_PROVIDER_SITE_OTHER): Payer: Self-pay | Admitting: Internal Medicine

## 2015-08-16 ENCOUNTER — Telehealth (INDEPENDENT_AMBULATORY_CARE_PROVIDER_SITE_OTHER): Payer: Self-pay | Admitting: Internal Medicine

## 2015-08-16 LAB — CD4/CD8
CD3 # (T CELL): 932 {cells}/uL (ref 892–2436)
CD3 % (T CELL): 80 %
CD4 # (HELPER T CELL): 438 {cells}/uL (ref 382–1614)
CD4 % (HELPER T CELL): 38 %
CD4:CD8 RATIO: 0.9 — ABNORMAL LOW (ref 1.0–3.4)
CD8 # (SUPPRESSOR T CELL): 495 {cells}/uL (ref 157–813)
CD8 % (SUPPRESSOR T CELL): 43 %

## 2015-08-16 NOTE — Telephone Encounter (Signed)
Mike [name] from [facility] lab called stating per this pt lab order there should have been a urine but no urine was received.  (dar 08/16/15)

## 2015-08-16 NOTE — Telephone Encounter (Signed)
Please schedule mr Ordaz for routine follow up  Mayer Camel, DO  08/16/2015, 13:28

## 2015-08-16 NOTE — Telephone Encounter (Signed)
PT WAS UNABLE TO PROVIDE SPECIMEN  Ocie Cornfield, LPN  03/21/5620, 12:48

## 2015-08-17 LAB — HIV-1 RNA QUANTITATIVE PCR, PLASMA
HIV-1 RNA, QUANT: <40 {copies}/mL (ref <40)
HIV-1 RNA: DETECTED — AB
LOG HIV COPIES/ML: <2 Log (10) Copies/mL (ref <1.60)
LOG HIV COPIES/ML: <2 {Log_copies}/mL (ref <1.60)

## 2015-08-17 NOTE — Telephone Encounter (Signed)
Gwen from [facility] called again re: urine.  Per [facility], as noted in message below, relayed to [facility] pt was unable to provide specimen. Gwen states clear understanding and states she will disregard urine request. (dar 08/17/15)

## 2015-08-17 NOTE — Telephone Encounter (Signed)
Pt returned call and scheduled follow up appt for 08/30/15. (dar 08/17/15)

## 2015-08-18 ENCOUNTER — Encounter (INDEPENDENT_AMBULATORY_CARE_PROVIDER_SITE_OTHER): Payer: Medicare Other | Admitting: Family Medicine

## 2015-08-26 ENCOUNTER — Encounter (INDEPENDENT_AMBULATORY_CARE_PROVIDER_SITE_OTHER): Payer: Medicare Other | Admitting: Family Medicine

## 2015-08-30 ENCOUNTER — Encounter (INDEPENDENT_AMBULATORY_CARE_PROVIDER_SITE_OTHER): Payer: Medicare Other | Admitting: Internal Medicine

## 2015-09-01 ENCOUNTER — Encounter (INDEPENDENT_AMBULATORY_CARE_PROVIDER_SITE_OTHER): Payer: Medicare Other | Admitting: Family Medicine

## 2015-09-06 ENCOUNTER — Encounter (INDEPENDENT_AMBULATORY_CARE_PROVIDER_SITE_OTHER): Payer: Self-pay | Admitting: Family Medicine

## 2015-09-06 ENCOUNTER — Encounter (INDEPENDENT_AMBULATORY_CARE_PROVIDER_SITE_OTHER): Payer: Medicare Other | Admitting: Internal Medicine

## 2015-09-06 ENCOUNTER — Ambulatory Visit (INDEPENDENT_AMBULATORY_CARE_PROVIDER_SITE_OTHER): Payer: Medicare Other | Admitting: Family Medicine

## 2015-09-06 VITALS — BP 142/71 | HR 98 | Temp 97.3°F | Resp 18 | Ht 74.0 in | Wt 144.3 lb

## 2015-09-06 DIAGNOSIS — F411 Generalized anxiety disorder: Secondary | ICD-10-CM

## 2015-09-06 DIAGNOSIS — J4520 Mild intermittent asthma, uncomplicated: Secondary | ICD-10-CM

## 2015-09-06 DIAGNOSIS — N411 Chronic prostatitis: Secondary | ICD-10-CM

## 2015-09-06 DIAGNOSIS — Z681 Body mass index (BMI) 19 or less, adult: Secondary | ICD-10-CM

## 2015-09-06 MED ORDER — MONTELUKAST 10 MG TABLET
ORAL_TABLET | ORAL | 5 refills | Status: AC
Start: 2015-09-06 — End: ?

## 2015-09-06 MED ORDER — TAMSULOSIN 0.4 MG CAPSULE
0.4000 mg | ORAL_CAPSULE | Freq: Every evening | ORAL | 5 refills | Status: AC
Start: 2015-09-06 — End: ?

## 2015-09-06 MED ORDER — ALBUTEROL SULFATE HFA 90 MCG/ACTUATION AEROSOL INHALER
INHALATION_SPRAY | RESPIRATORY_TRACT | 11 refills | Status: AC
Start: 2015-09-06 — End: ?

## 2015-09-06 MED ORDER — TIZANIDINE 4 MG TABLET
4.0000 mg | ORAL_TABLET | Freq: Three times a day (TID) | ORAL | 5 refills | Status: AC
Start: 2015-09-06 — End: ?

## 2015-09-06 MED ORDER — CLONAZEPAM 1 MG TABLET
1.0000 mg | ORAL_TABLET | Freq: Two times a day (BID) | ORAL | 5 refills | Status: AC
Start: 2015-09-06 — End: ?

## 2015-09-06 NOTE — Progress Notes (Signed)
Body mass index is 18.53 kg/(m^2).

## 2015-09-06 NOTE — Progress Notes (Signed)
[facility]  [address]  [place] [address]  Phone: [phone]  Fax: [phone]      Patient name: Tyresse Jayson  MRN:  [id number]   DOB: 1960/09/09   DATE:  09/06/2015     Subjective:     Patient ID: Kailon Treese is a 55 y.o. male    Chief Complaint:  Chief Complaint   Patient presents with    FOLLOW UP MED CHECK        HPI - This is a 55 y.o. year old male who comes in today for follow up. He has been under increased stress lately because he is trying to sell his house. He is getting ready to move.       Review of Systems   Constitutional: Negative for chills and fever.   Respiratory: Negative for shortness of breath.    Cardiovascular: Negative for chest pain.   Gastrointestinal: Positive for diarrhea (mild intermittent). Negative for nausea and vomiting.   Psychiatric/Behavioral: Negative for dysphoric mood. The patient is not nervous/anxious.          Objective:     Visit Vitals    BP (!) 142/71    Pulse 98    Temp 36.3 C (97.3 F) (Tympanic)    Resp 18    Ht 1.88 m ( )    Wt 65.5 kg (144 lb 4.8 oz)    SpO2 97%    BMI 18.53 kg/m2        Physical Exam   Constitutional: He is oriented to person, place, and time. He appears well-developed and well-nourished.   HENT:   Head: Normocephalic and atraumatic.   Neck: Normal range of motion. Neck supple.   Cardiovascular: Normal rate, regular rhythm and normal heart sounds.    Pulmonary/Chest: Effort normal and breath sounds normal.   Musculoskeletal: Normal range of motion.   Neurological: He is alert and oriented to person, place, and time.   Skin: Skin is warm and dry. No rash noted. No erythema. No pallor.   Psychiatric: He has a normal mood and affect. His behavior is normal. Judgment and thought content normal.        Assessment & Plan:       ICD-10-CM    1. 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 Body mass index (BMI) less than 19 Z68.1    3. 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 Mild intermittent asthma without complication J45.20               Education    The patient was given the opportunity to ask questions and those questions were answered to the patient's satisfaction. The patient was encouraged to call with any additional questions or concerns.   Discussed with patient effects and side effects of medications. Medication safety was discussed. A copy of the patient's medication list was printed and given to the patient.       follow-up in 6 months     Randell Patient, MD

## 2016-03-06 ENCOUNTER — Encounter (INDEPENDENT_AMBULATORY_CARE_PROVIDER_SITE_OTHER): Payer: Self-pay | Admitting: Family Medicine

## 2016-03-28 ENCOUNTER — Other Ambulatory Visit (INDEPENDENT_AMBULATORY_CARE_PROVIDER_SITE_OTHER): Payer: Self-pay | Admitting: Family Medicine

## 2016-03-28 NOTE — Telephone Encounter (Signed)
Rx pending please sign and accept thanks Keaghan Staton

## 2016-05-13 ENCOUNTER — Other Ambulatory Visit: Payer: Self-pay

## 2016-07-08 ENCOUNTER — Other Ambulatory Visit (INDEPENDENT_AMBULATORY_CARE_PROVIDER_SITE_OTHER): Payer: Self-pay | Admitting: Family Medicine

## 2016-07-10 ENCOUNTER — Ambulatory Visit (INDEPENDENT_AMBULATORY_CARE_PROVIDER_SITE_OTHER): Payer: Self-pay | Admitting: Family Medicine

## 2017-05-19 ENCOUNTER — Other Ambulatory Visit: Payer: Self-pay

## 2017-05-30 ENCOUNTER — Telehealth (HOSPITAL_BASED_OUTPATIENT_CLINIC_OR_DEPARTMENT_OTHER): Payer: Self-pay | Admitting: Internal Medicine

## 2017-05-30 NOTE — Telephone Encounter (Signed)
Patient was last seen August 2016.  Patient had labs done January 2017 and was a No Show for his routine follow up appointment on 09/06/2015.  I did attempt to contact the patient to schedule a follow up appointment.  However, the phone number on file is not a working phone number.  Idamae Lusherorothy Lianne Carreto, MA  05/30/2017, 15:55

## 2017-06-25 NOTE — Telephone Encounter (Signed)
Letter mailed to patient with instructions to contact the office to reschedule his missed appointment.  Idamae Lusherorothy Savon Bordonaro, MA  06/25/2017, 12:30

## 2017-07-02 NOTE — Telephone Encounter (Signed)
Letter returned for bad address.  No way to contact patient.  Mike Lusherorothy Naftali Carchi, MA  07/02/2017, 11:57

## 2018-04-17 ENCOUNTER — Telehealth: Payer: Self-pay | Admitting: *Deleted

## 2018-04-17 NOTE — Telephone Encounter (Signed)
Received referral for low dose lung cancer screening CT scan. Message left at phone number listed in EMR for patient to call me back to facilitate scheduling scan.  

## 2018-04-24 ENCOUNTER — Telehealth: Payer: Self-pay | Admitting: *Deleted

## 2018-04-24 ENCOUNTER — Encounter: Payer: Self-pay | Admitting: *Deleted

## 2018-04-24 DIAGNOSIS — Z122 Encounter for screening for malignant neoplasm of respiratory organs: Secondary | ICD-10-CM

## 2018-04-24 DIAGNOSIS — Z87891 Personal history of nicotine dependence: Secondary | ICD-10-CM

## 2018-04-24 NOTE — Telephone Encounter (Signed)
Received referral for initial lung cancer screening scan. Contacted patient and obtained smoking history,(current, 49 pack year) as well as answering questions related to screening process. Patient denies signs of lung cancer such as weight loss or hemoptysis. Patient denies comorbidity that would prevent curative treatment if lung cancer were found. Patient is scheduled for shared decision making visit and CT scan on 05/28/18 at 1330.

## 2018-04-24 NOTE — Telephone Encounter (Signed)
Received referral for low dose lung cancer screening CT scan. Message left at phone number listed in EMR for patient to call me back to facilitate scheduling scan.  

## 2018-05-27 ENCOUNTER — Telehealth: Payer: Self-pay | Admitting: *Deleted

## 2018-05-27 NOTE — Telephone Encounter (Signed)
Called pt to remind of appt for ldct screening for tomorrow 05-28-18@1330 , message left for patient

## 2018-05-28 ENCOUNTER — Encounter: Payer: Self-pay | Admitting: Nurse Practitioner

## 2018-05-28 ENCOUNTER — Inpatient Hospital Stay: Payer: Medicare Other | Attending: Nurse Practitioner | Admitting: Nurse Practitioner

## 2018-05-28 ENCOUNTER — Ambulatory Visit: Admission: RE | Admit: 2018-05-28 | Payer: Medicare Other | Source: Ambulatory Visit

## 2018-05-29 ENCOUNTER — Telehealth: Payer: Self-pay | Admitting: *Deleted

## 2018-05-29 NOTE — Telephone Encounter (Signed)
Received referral for low dose lung cancer screening CT scan.  Message left at phone number listed in EMR for patient to call either myself or Burgess Estelle back at [phone] to facilitate scheduling the scan.

## 2018-06-12 ENCOUNTER — Telehealth: Payer: Self-pay | Admitting: *Deleted

## 2018-06-12 NOTE — Telephone Encounter (Signed)
Received referral for low dose lung cancer screening CT scan. Message left at phone number listed in EMR for patient to call me back to facilitate scheduling scan.  

## 2018-06-19 ENCOUNTER — Encounter: Payer: Self-pay | Admitting: *Deleted

## 2018-06-29 ENCOUNTER — Emergency Department: Payer: No Typology Code available for payment source

## 2018-06-29 ENCOUNTER — Inpatient Hospital Stay
Admission: EM | Admit: 2018-06-29 | Discharge: 2018-07-01 | DRG: 180 | Disposition: A | Discharge status: Hospice | Payer: No Typology Code available for payment source | Attending: Internal Medicine | Admitting: Internal Medicine

## 2018-06-29 ENCOUNTER — Other Ambulatory Visit: Payer: Self-pay

## 2018-06-29 ENCOUNTER — Inpatient Hospital Stay: Payer: No Typology Code available for payment source

## 2018-06-29 DIAGNOSIS — C3490 Malignant neoplasm of unspecified part of unspecified bronchus or lung: Secondary | ICD-10-CM | POA: Diagnosis not present

## 2018-06-29 DIAGNOSIS — Z681 Body mass index (BMI) 19 or less, adult: Secondary | ICD-10-CM

## 2018-06-29 DIAGNOSIS — Z515 Encounter for palliative care: Secondary | ICD-10-CM | POA: Diagnosis not present

## 2018-06-29 DIAGNOSIS — C787 Secondary malignant neoplasm of liver and intrahepatic bile duct: Secondary | ICD-10-CM | POA: Diagnosis present

## 2018-06-29 DIAGNOSIS — R590 Localized enlarged lymph nodes: Secondary | ICD-10-CM

## 2018-06-29 DIAGNOSIS — G939 Disorder of brain, unspecified: Secondary | ICD-10-CM | POA: Diagnosis not present

## 2018-06-29 DIAGNOSIS — B20 Human immunodeficiency virus [HIV] disease: Secondary | ICD-10-CM | POA: Diagnosis present

## 2018-06-29 DIAGNOSIS — Y92410 Unspecified street and highway as the place of occurrence of the external cause: Secondary | ICD-10-CM

## 2018-06-29 DIAGNOSIS — Z66 Do not resuscitate: Secondary | ICD-10-CM | POA: Diagnosis present

## 2018-06-29 DIAGNOSIS — C7931 Secondary malignant neoplasm of brain: Secondary | ICD-10-CM

## 2018-06-29 DIAGNOSIS — Z79899 Other long term (current) drug therapy: Secondary | ICD-10-CM

## 2018-06-29 DIAGNOSIS — C3400 Malignant neoplasm of unspecified main bronchus: Secondary | ICD-10-CM

## 2018-06-29 DIAGNOSIS — Z882 Allergy status to sulfonamides status: Secondary | ICD-10-CM

## 2018-06-29 DIAGNOSIS — C7970 Secondary malignant neoplasm of unspecified adrenal gland: Secondary | ICD-10-CM | POA: Diagnosis present

## 2018-06-29 DIAGNOSIS — Z21 Asymptomatic human immunodeficiency virus [HIV] infection status: Secondary | ICD-10-CM | POA: Diagnosis not present

## 2018-06-29 DIAGNOSIS — K5900 Constipation, unspecified: Secondary | ICD-10-CM | POA: Diagnosis not present

## 2018-06-29 DIAGNOSIS — F17210 Nicotine dependence, cigarettes, uncomplicated: Secondary | ICD-10-CM | POA: Diagnosis present

## 2018-06-29 DIAGNOSIS — S2232XA Fracture of one rib, left side, initial encounter for closed fracture: Secondary | ICD-10-CM | POA: Diagnosis present

## 2018-06-29 DIAGNOSIS — E43 Unspecified severe protein-calorie malnutrition: Secondary | ICD-10-CM | POA: Diagnosis present

## 2018-06-29 DIAGNOSIS — J449 Chronic obstructive pulmonary disease, unspecified: Secondary | ICD-10-CM | POA: Diagnosis present

## 2018-06-29 DIAGNOSIS — R918 Other nonspecific abnormal finding of lung field: Secondary | ICD-10-CM | POA: Diagnosis present

## 2018-06-29 HISTORY — DX: Asymptomatic human immunodeficiency virus (hiv) infection status: Z21

## 2018-06-29 HISTORY — DX: Human immunodeficiency virus (HIV) disease: B20

## 2018-06-29 HISTORY — DX: Nicotine dependence, unspecified, uncomplicated: F17.200

## 2018-06-29 LAB — CBC WITH DIFFERENTIAL/PLATELET
Abs Immature Granulocytes: 0.07 10*3/uL (ref 0.00–0.07)
Basophils Absolute: 0.1 10*3/uL (ref 0.0–0.1)
Basophils Relative: 1 %
Eosinophils Absolute: 0.1 10*3/uL (ref 0.0–0.5)
Eosinophils Relative: 2 %
HCT: 38.4 % — ABNORMAL LOW (ref 39.0–52.0)
Hemoglobin: 12.6 g/dL — ABNORMAL LOW (ref 13.0–17.0)
IMMATURE GRANULOCYTES: 1 %
Lymphocytes Relative: 15 %
Lymphs Abs: 1 10*3/uL (ref 0.7–4.0)
MCH: 28.2 pg (ref 26.0–34.0)
MCHC: 32.8 g/dL (ref 30.0–36.0)
MCV: 85.9 fL (ref 80.0–100.0)
Monocytes Absolute: 0.7 10*3/uL (ref 0.1–1.0)
Monocytes Relative: 10 %
Neutro Abs: 4.7 10*3/uL (ref 1.7–7.7)
Neutrophils Relative %: 71 %
PLATELETS: 246 10*3/uL (ref 150–400)
RBC: 4.47 MIL/uL (ref 4.22–5.81)
RDW: 14.6 % (ref 11.5–15.5)
WBC: 6.6 10*3/uL (ref 4.0–10.5)
nRBC: 0 % (ref 0.0–0.2)

## 2018-06-29 LAB — COMPREHENSIVE METABOLIC PANEL
ALBUMIN: 2.7 g/dL — AB (ref 3.5–5.0)
ALT: 18 U/L (ref 0–44)
ANION GAP: 8 (ref 5–15)
AST: 25 U/L (ref 15–41)
Alkaline Phosphatase: 66 U/L (ref 38–126)
BUN: 22 mg/dL — ABNORMAL HIGH (ref 6–20)
CO2: 26 mmol/L (ref 22–32)
Calcium: 8.6 mg/dL — ABNORMAL LOW (ref 8.9–10.3)
Chloride: 100 mmol/L (ref 98–111)
Creatinine, Ser: 0.71 mg/dL (ref 0.61–1.24)
GFR calc Af Amer: >60 mL/min (ref >60)
GFR calc non Af Amer: >60 mL/min (ref >60)
GLUCOSE: 84 mg/dL (ref 70–99)
Potassium: 3.8 mmol/L (ref 3.5–5.1)
Sodium: 134 mmol/L — ABNORMAL LOW (ref 135–145)
Total Bilirubin: 0.6 mg/dL (ref 0.3–1.2)
Total Protein: 6.8 g/dL (ref 6.5–8.1)

## 2018-06-29 LAB — URINALYSIS, COMPLETE (UACMP) WITH MICROSCOPIC
Bacteria, UA: NONE SEEN
Bilirubin Urine: NEGATIVE
Glucose, UA: NEGATIVE mg/dL
Ketones, ur: NEGATIVE mg/dL
Leukocytes, UA: NEGATIVE
NITRITE: NEGATIVE
Protein, ur: 100 mg/dL — AB
Specific Gravity, Urine: 1.023 (ref 1.005–1.030)
Squamous Epithelial / HPF: NONE SEEN (ref 0–5)
pH: 6 (ref 5.0–8.0)

## 2018-06-29 LAB — TROPONIN I: Troponin I: <0.03 ng/mL (ref <0.03)

## 2018-06-29 MED ORDER — EMTRICITABINE-TENOFOVIR AF 200-25 MG PO TABS
1.0000 | ORAL_TABLET | Freq: Every day | ORAL | Status: DC
  Administered 2018-06-30 – 2018-07-01 (×2): 1 via ORAL
  Filled 2018-06-29 (×3): qty 1

## 2018-06-29 MED ORDER — BUDESONIDE 0.5 MG/2ML IN SUSP
0.5000 mg | Freq: Two times a day (BID) | RESPIRATORY_TRACT | Status: DC
  Administered 2018-06-29 – 2018-07-01 (×4): 0.5 mg via RESPIRATORY_TRACT
  Filled 2018-06-29 (×4): qty 2

## 2018-06-29 MED ORDER — TRAZODONE HCL 50 MG PO TABS: 50.0000 mg | ORAL_TABLET | Freq: Every evening | ORAL | Status: DC | PRN

## 2018-06-29 MED ORDER — GADOBUTROL 1 MMOL/ML IV SOLN
6.0000 mL | Freq: Once | INTRAVENOUS | Status: AC | PRN
  Administered 2018-06-29: 6 mL via INTRAVENOUS

## 2018-06-29 MED ORDER — DOLUTEGRAVIR SODIUM 50 MG PO TABS
50.0000 mg | ORAL_TABLET | Freq: Every day | ORAL | Status: DC
  Administered 2018-06-30 – 2018-07-01 (×2): 50 mg via ORAL
  Filled 2018-06-29 (×3): qty 1

## 2018-06-29 MED ORDER — SODIUM CHLORIDE 0.9% FLUSH: 3.0000 mL | INTRAVENOUS | Status: DC | PRN

## 2018-06-29 MED ORDER — IOHEXOL 300 MG/ML  SOLN
100.0000 mL | Freq: Once | INTRAMUSCULAR | Status: AC | PRN
  Administered 2018-06-29: 100 mL via INTRAVENOUS

## 2018-06-29 MED ORDER — GABAPENTIN 100 MG PO CAPS
200.0000 mg | ORAL_CAPSULE | Freq: Every day | ORAL | Status: DC
  Administered 2018-06-30 – 2018-07-01 (×2): 200 mg via ORAL
  Filled 2018-06-29 (×2): qty 2

## 2018-06-29 MED ORDER — POLYETHYLENE GLYCOL 3350 17 G PO PACK: 17.0000 g | PACK | Freq: Every day | ORAL | Status: DC | PRN

## 2018-06-29 MED ORDER — ONDANSETRON HCL 4 MG PO TABS: 4.0000 mg | ORAL_TABLET | Freq: Four times a day (QID) | ORAL | Status: DC | PRN

## 2018-06-29 MED ORDER — BISACODYL 10 MG RE SUPP
10.0000 mg | Freq: Every day | RECTAL | Status: DC | PRN
  Filled 2018-06-29: qty 1

## 2018-06-29 MED ORDER — ACETAMINOPHEN 650 MG RE SUPP: 650.0000 mg | Freq: Four times a day (QID) | RECTAL | Status: DC | PRN

## 2018-06-29 MED ORDER — MORPHINE SULFATE (PF) 2 MG/ML IV SOLN
2.0000 mg | INTRAVENOUS | Status: DC | PRN
  Administered 2018-06-29 – 2018-06-30 (×2): 2 mg via INTRAVENOUS
  Filled 2018-06-29 (×2): qty 1

## 2018-06-29 MED ORDER — DOCUSATE SODIUM 100 MG PO CAPS
100.0000 mg | ORAL_CAPSULE | Freq: Two times a day (BID) | ORAL | Status: DC
  Administered 2018-06-29 – 2018-07-01 (×4): 100 mg via ORAL
  Filled 2018-06-29 (×4): qty 1

## 2018-06-29 MED ORDER — SODIUM CHLORIDE 0.9% FLUSH
3.0000 mL | Freq: Two times a day (BID) | INTRAVENOUS | Status: DC
  Administered 2018-06-29 – 2018-07-01 (×4): 3 mL via INTRAVENOUS

## 2018-06-29 MED ORDER — SODIUM CHLORIDE 0.9 % IV SOLN: 250.0000 mL | INTRAVENOUS | Status: DC | PRN

## 2018-06-29 MED ORDER — NICOTINE 21 MG/24HR TD PT24
21.0000 mg | MEDICATED_PATCH | Freq: Every day | TRANSDERMAL | Status: DC
  Administered 2018-06-30 – 2018-07-01 (×2): 21 mg via TRANSDERMAL
  Filled 2018-06-29 (×2): qty 1

## 2018-06-29 MED ORDER — IPRATROPIUM-ALBUTEROL 0.5-2.5 (3) MG/3ML IN SOLN
3.0000 mL | Freq: Four times a day (QID) | RESPIRATORY_TRACT | Status: DC
  Administered 2018-06-29 – 2018-07-01 (×7): 3 mL via RESPIRATORY_TRACT
  Filled 2018-06-29 (×8): qty 3

## 2018-06-29 MED ORDER — ENOXAPARIN SODIUM 40 MG/0.4ML ~~LOC~~ SOLN: 40.0000 mg | SUBCUTANEOUS | Status: DC

## 2018-06-29 MED ORDER — MONTELUKAST SODIUM 10 MG PO TABS
10.0000 mg | ORAL_TABLET | Freq: Every day | ORAL | Status: DC
  Administered 2018-06-30 – 2018-07-01 (×2): 10 mg via ORAL
  Filled 2018-06-29 (×2): qty 1

## 2018-06-29 MED ORDER — VARENICLINE TARTRATE 1 MG PO TABS: 1.0000 mg | ORAL_TABLET | Freq: Every day | ORAL | Status: DC

## 2018-06-29 MED ORDER — HYDROCODONE-ACETAMINOPHEN 5-325 MG PO TABS
1.0000 | ORAL_TABLET | ORAL | Status: DC | PRN
  Administered 2018-06-29 – 2018-06-30 (×2): 1 via ORAL
  Administered 2018-06-30: 2 via ORAL
  Administered 2018-07-01: 1 via ORAL
  Filled 2018-06-29: qty 2
  Filled 2018-06-29: qty 1
  Filled 2018-06-29: qty 2
  Filled 2018-06-29: qty 1

## 2018-06-29 MED ORDER — ADULT MULTIVITAMIN W/MINERALS CH
1.0000 | ORAL_TABLET | Freq: Every day | ORAL | Status: DC
  Administered 2018-06-30 – 2018-07-01 (×2): 1 via ORAL
  Filled 2018-06-29 (×2): qty 1

## 2018-06-29 MED ORDER — ACETAMINOPHEN 325 MG PO TABS: 650.0000 mg | ORAL_TABLET | Freq: Four times a day (QID) | ORAL | Status: DC | PRN

## 2018-06-29 MED ORDER — ONDANSETRON HCL 4 MG/2ML IJ SOLN: 4.0000 mg | Freq: Four times a day (QID) | INTRAMUSCULAR | Status: DC | PRN

## 2018-06-29 NOTE — ED Notes (Signed)
Dr. Jerelyn Charles at bedside.

## 2018-06-29 NOTE — Progress Notes (Signed)
Patient in room, vital signs obtained. Will give report to oncoming nurse.

## 2018-06-29 NOTE — Progress Notes (Signed)
Family Meeting Note  Advance Directive:yes  Today a meeting took place with the Peter Gregory.  Peter Gregory is able to participate   The following clinical team members were present during this meeting:MD  The following were discussed:Peter Gregory's diagnosis: Metastatic lung cancer, Peter Gregory's progosis: Unable to determine and Goals for treatment: DNR  Additional follow-up to be provided: prn  Time spent during discussion:20 minutes  Gorden Harms, MD

## 2018-06-29 NOTE — ED Notes (Addendum)
Pt ambulatory to toilet with steady gait noted. Did NOT collect urine sample.
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
## 2018-06-30 ENCOUNTER — Encounter: Payer: Self-pay | Admitting: Internal Medicine

## 2018-06-30 DIAGNOSIS — G939 Disorder of brain, unspecified: Secondary | ICD-10-CM

## 2018-06-30 DIAGNOSIS — Z21 Asymptomatic human immunodeficiency virus [HIV] infection status: Secondary | ICD-10-CM

## 2018-06-30 DIAGNOSIS — R918 Other nonspecific abnormal finding of lung field: Secondary | ICD-10-CM | POA: Diagnosis present

## 2018-06-30 DIAGNOSIS — F17210 Nicotine dependence, cigarettes, uncomplicated: Secondary | ICD-10-CM

## 2018-06-30 MED ORDER — DEXAMETHASONE SODIUM PHOSPHATE 4 MG/ML IJ SOLN
4.0000 mg | Freq: Four times a day (QID) | INTRAMUSCULAR | Status: DC
  Administered 2018-06-30 – 2018-07-01 (×3): 4 mg via INTRAVENOUS
  Filled 2018-06-30 (×3): qty 1

## 2018-06-30 MED ORDER — ENSURE ENLIVE PO LIQD
237.0000 mL | Freq: Three times a day (TID) | ORAL | Status: DC
  Administered 2018-07-01 (×2): 237 mL via ORAL

## 2018-06-30 MED ORDER — DEXAMETHASONE SODIUM PHOSPHATE 10 MG/ML IJ SOLN
10.0000 mg | Freq: Once | INTRAMUSCULAR | Status: AC
  Administered 2018-06-30: 10 mg via INTRAVENOUS
  Filled 2018-06-30: qty 1
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
## 2018-06-30 NOTE — Plan of Care (Signed)
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
## 2018-06-30 NOTE — Progress Notes (Signed)
Family Meeting Note  Advance Directive:yes  Today a meeting took place with the Patient and sister.   The following clinical team members were present during this meeting:MD  The following were discussed:Patient's diagnosis: Metastatic lung cancer, metastasis to brain, Patient's progosis: < 6 months and Goals for treatment: DNR  Due to multiple metastasis to liver, brain, adrenal gland, patient had already been falling down frequently, having history of HIV, patient and his sister does not want to have any aggressive treatment for this cancer and want to be DNR. They agreed for hospice arrangement possibly at home at discharge.  Additional follow-up to be provided: Palliative care and hospice  Time spent during discussion:20 minutes  Vaughan Basta, MD
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
## 2018-06-30 NOTE — Plan of Care (Signed)
  Problem: Education: Goal: Knowledge of General Education information will improve Description Including pain rating scale, medication(s)/side effects and non-pharmacologic comfort measures 06/30/2018 1531 by Herbie Baltimore, RN Outcome: Progressing 06/30/2018 1530 by Herbie Baltimore, RN Outcome: Progressing   Problem: Activity: Goal: Risk for activity intolerance will decrease 06/30/2018 1531 by Herbie Baltimore, RN Outcome: Progressing 06/30/2018 1530 by Herbie Baltimore, RN Outcome: Progressing   Problem: Pain Managment: Goal: General experience of comfort will improve 06/30/2018 1531 by Herbie Baltimore, RN Outcome: Progressing 06/30/2018 1530 by Herbie Baltimore, RN Outcome: Progressing   Problem: Safety: Goal: Ability to remain free from injury will improve 06/30/2018 1531 by Herbie Baltimore, RN Outcome: Progressing 06/30/2018 1530 by Herbie Baltimore, RN Outcome: Progressing   Problem: Skin Integrity: Goal: Risk for impaired skin integrity will decrease 06/30/2018 1531 by Herbie Baltimore, RN Outcome: Progressing 06/30/2018 1530 by Herbie Baltimore, RN Outcome: Progressing
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
## 2018-07-01 DIAGNOSIS — C7931 Secondary malignant neoplasm of brain: Secondary | ICD-10-CM

## 2018-07-01 DIAGNOSIS — Z515 Encounter for palliative care: Secondary | ICD-10-CM

## 2018-07-01 DIAGNOSIS — E43 Unspecified severe protein-calorie malnutrition: Secondary | ICD-10-CM

## 2018-07-01 MED ORDER — DEXAMETHASONE SODIUM PHOSPHATE 4 MG/ML IJ SOLN: 4.0000 mg | Freq: Four times a day (QID) | INTRAMUSCULAR | 0 refills | Status: DC

## 2018-07-01 MED ORDER — HYDROCODONE-ACETAMINOPHEN 5-325 MG PO TABS: 1.0000 | ORAL_TABLET | Freq: Four times a day (QID) | ORAL | 0 refills | Status: AC | PRN

## 2018-07-01 MED ORDER — DEXAMETHASONE 0.75 MG PO TABS: 0.7500 mg | ORAL_TABLET | Freq: Three times a day (TID) | ORAL | 0 refills | Status: AC

## 2018-07-01 MED ORDER — DOCUSATE SODIUM 100 MG PO CAPS: 100.0000 mg | ORAL_CAPSULE | Freq: Two times a day (BID) | ORAL | 0 refills | Status: AC

## 2018-07-01 NOTE — Care Management Note (Signed)
Case Management Note  Patient Details  Name: REED DADY MRN: [id number] Date of Birth: 1960/10/13  Subjective/Objective:     Admitted to [facility] with the diagnosis of metastatic lung cancer. Lives alone. Sister is Timothy Lasso [phone]). Niece Otis Dials will be moving in his home for support and help. Sees Dr. Lavone Orn. Prescriptions are filled at [facility] in [place].  No home health. No skilled nursing. No home oxygen, No medical equipment in the home. No falls. Fair appetite               Action/Plan: Received referral for Hospice services in the home. Discussed agencies with Mr. Jumper at the bedside. [facility]. Will update Flo Shanks RN representative for [facility] Requested hospital bed and wheelchair. Will be seeing Dr. Burlene Arnt at the [facility]   Expected Discharge Date:                  Expected Discharge Plan:     In-House Referral:   yes  Discharge planning Services   yes  Post Acute Care Choice:   yes                                                                              Choice offered to:   Mr. Genevieve Norlander  DME Arranged:    DME Agency:     HH Arranged:   yes  HH Agency:   [facility]  Status of Service:     If discussed at [place] of Stay Meetings, dates discussed:    Additional Comments:  Shelbie Ammons, RN MSN CCM Care Management [phone] 07/01/2018, 10:38 AM
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
## 2018-07-01 NOTE — Progress Notes (Signed)
   07/01/18 1250  Clinical Encounter Type  Visited With Other (Comment)  Visit Type Initial;Spiritual support  Referral From Nurse;Palliative care team  Consult/Referral To Chaplain  Spiritual Encounters  Spiritual Needs Other (Comment)   [place] received a page from 1C to assist the patient, Mr. Peter Gregory, to complete an AD. I will follow up with the patient.

## 2018-07-01 NOTE — Progress Notes (Signed)
   07/01/18 1350  Clinical Encounter Type  Visited With Patient and family together  Visit Type Initial;Spiritual support  Referral From Nurse  Consult/Referral To Chaplain  Spiritual Encounters  Spiritual Needs Other (Comment)   [place] provided education for the patient on the AD process. I will follow up if needed.
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
## 2018-07-01 NOTE — Progress Notes (Signed)
Pt is being discharged home with hospice.  Discharge papers given explained to pt's sister, Olive Bass, verbalized understanding.  Meds and appointment reviewed.  Rx given.

## 2018-07-01 NOTE — Progress Notes (Signed)
   07/01/18 1500  Clinical Encounter Type  Visited With Patient and family together  Visit Type Follow-up;Spiritual support;Other (Comment)  Referral From Nurse  Consult/Referral To Chaplain  Spiritual Encounters  Spiritual Needs Emotional;Other (Comment)   [facility] helped the patient complete his AD. The patient's family helped to secure witnesses and the notary on 1C was utilized. The original was returned to the patient and a copy was put into the patient's chart.

## 2018-07-01 NOTE — Discharge Instructions (Signed)
Hospice Introduction Hospice is a service that is designed to provide people who are terminally ill and their families with medical, spiritual, and psychological support. Its aim is to improve your quality of life by keeping you as alert and comfortable as possible. Who will be my providers when I begin hospice care? Hospice teams often include:  A nurse.  A doctor. The hospice doctor will be available for your care, but you can bring your regular doctor or nurse practitioner.  Social workers.  Religious leaders (such as a Clinical biochemist).  Trained volunteers.  What roles will providers play in my care? Hospice is performed by a team of health care professionals and volunteers who:  Help keep you comfortable: ? Hospice can be provided in your home or in a homelike setting. ? The hospice staff works with your family and friends to help meet your needs. ? You will enjoy the support of loved ones by receiving much of your basic care from family and friends.  Provide pain relief and manage your symptoms. The staff supply all necessary medicines and equipment.  Provide companionship when you are alone.  Allow you and your family to rest. They may do light housekeeping, prepare meals, and run errands.  Provide counseling. They will make sure your emotional, spiritual, and social needs and those of your family are being met.  Provide spiritual care: ? Spiritual care will be individualized to meet your needs and your family's needs. ? Spiritual care may involve:  Helping you look at what death means to you.  Helping you say goodbye to your family and friends.  Performing a specific religious ceremony or ritual.  When should hospice care begin? Most people who use hospice are believed to have fewer than 6 months to live.  Your family and health care providers can help you decide when hospice services should begin.  If your condition improves, you may discontinue the program.  What  should I consider before selecting a program? Most hospice programs are run by nonprofit, independent organizations. Some are affiliated with hospitals, nursing homes, or home health care agencies. Hospice programs can take place in the home or at a hospice center, hospital, or skilled nursing facility. When choosing a hospice program, ask the following questions:  What services are available to me?  What services will be offered to my loved ones?  How involved will my loved ones be?  How involved will my health care provider be?  Who makes up the hospice care team? How are they trained or screened?  How will my pain and symptoms be managed?  If my circumstances change, can the services be provided in a different setting, such as my home or in the hospital?  Is the program reviewed and licensed by the state or certified in some other way?  Where can I learn more about hospice? You can learn about existing hospice programs in your area from your health care providers. You can also read more about hospice online. The websites of the following organizations contain helpful information:  The [facility] and Palliative Care Organization [facility]).  The H[employer] Association of America ([place]).  The [place].  The American Cancer Society (ACS).  [facility] [facility].  This information is not intended to replace advice given to you by your health care provider. Make sure you discuss any questions you have with your health care provider. Document Released: 10/27/2003 Document Revised: 02/24/2016 Document Reviewed: 05/20/2013 Elsevier Interactive Patient Education  2017 [employer].

## 2018-07-02 LAB — HIV 1/2 AB DIFFERENTIATION
HIV 1 Ab: POSITIVE — AB
HIV 2 Ab: NEGATIVE

## 2018-07-02 LAB — HIV ANTIBODY (ROUTINE TESTING W REFLEX): HIV SCREEN 4TH GENERATION: REACTIVE — AB

## 2018-08-24 DEATH — deceased

## 2019-03-22 ENCOUNTER — Other Ambulatory Visit: Payer: Self-pay
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
## 2020-08-19 ENCOUNTER — Other Ambulatory Visit (INDEPENDENT_AMBULATORY_CARE_PROVIDER_SITE_OTHER): Payer: Self-pay | Admitting: Family Medicine
